# Patient Record
Sex: Male | Born: 1980
Health system: Southern US, Community
[De-identification: ages and names within clinical notes are randomized; demographics above are authoritative.]

## PROBLEM LIST (undated history)

## (undated) DIAGNOSIS — K5190 Ulcerative colitis, unspecified, without complications: Secondary | ICD-10-CM

## (undated) HISTORY — PX: WISDOM TOOTH EXTRACTION: SHX21

---

## 2008-02-26 ENCOUNTER — Emergency Department (HOSPITAL_COMMUNITY): Admission: EM | Admit: 2008-02-26 | Discharge: 2008-02-27 | Payer: Self-pay | Admitting: Emergency Medicine

## 2012-01-11 HISTORY — PX: COLONOSCOPY: SHX174

## 2015-06-16 DIAGNOSIS — K1370 Unspecified lesions of oral mucosa: Secondary | ICD-10-CM | POA: Diagnosis not present

## 2015-11-12 DIAGNOSIS — K5130 Ulcerative (chronic) rectosigmoiditis without complications: Secondary | ICD-10-CM | POA: Diagnosis not present

## 2016-01-18 DIAGNOSIS — J029 Acute pharyngitis, unspecified: Secondary | ICD-10-CM | POA: Diagnosis not present

## 2016-06-27 DIAGNOSIS — L309 Dermatitis, unspecified: Secondary | ICD-10-CM | POA: Diagnosis not present

## 2017-01-20 DIAGNOSIS — K5130 Ulcerative (chronic) rectosigmoiditis without complications: Secondary | ICD-10-CM | POA: Diagnosis not present

## 2017-02-07 DIAGNOSIS — Z3009 Encounter for other general counseling and advice on contraception: Secondary | ICD-10-CM | POA: Diagnosis not present

## 2018-01-16 DIAGNOSIS — Z3009 Encounter for other general counseling and advice on contraception: Secondary | ICD-10-CM | POA: Diagnosis not present

## 2018-02-01 DIAGNOSIS — Z302 Encounter for sterilization: Secondary | ICD-10-CM | POA: Diagnosis not present

## 2018-02-02 DIAGNOSIS — Z302 Encounter for sterilization: Secondary | ICD-10-CM | POA: Diagnosis not present

## 2018-03-25 ENCOUNTER — Emergency Department (HOSPITAL_COMMUNITY)
Admission: EM | Admit: 2018-03-25 | Discharge: 2018-03-25 | Disposition: A | Discharge status: Home / Self Care | Payer: BLUE CROSS/BLUE SHIELD | Attending: Emergency Medicine | Admitting: Emergency Medicine

## 2018-03-25 ENCOUNTER — Encounter (HOSPITAL_COMMUNITY): Payer: Self-pay

## 2018-03-25 ENCOUNTER — Emergency Department (HOSPITAL_COMMUNITY): Payer: BLUE CROSS/BLUE SHIELD

## 2018-03-25 ENCOUNTER — Other Ambulatory Visit: Payer: Self-pay

## 2018-03-25 DIAGNOSIS — M5416 Radiculopathy, lumbar region: Secondary | ICD-10-CM | POA: Diagnosis not present

## 2018-03-25 DIAGNOSIS — R52 Pain, unspecified: Secondary | ICD-10-CM | POA: Diagnosis not present

## 2018-03-25 DIAGNOSIS — Z79899 Other long term (current) drug therapy: Secondary | ICD-10-CM | POA: Insufficient documentation

## 2018-03-25 DIAGNOSIS — M5489 Other dorsalgia: Secondary | ICD-10-CM | POA: Diagnosis not present

## 2018-03-25 DIAGNOSIS — R200 Anesthesia of skin: Secondary | ICD-10-CM | POA: Diagnosis not present

## 2018-03-25 DIAGNOSIS — M545 Low back pain: Secondary | ICD-10-CM | POA: Diagnosis present

## 2018-03-25 MED ORDER — IBUPROFEN 600 MG PO TABS: 600.0000 mg | ORAL_TABLET | Freq: Four times a day (QID) | ORAL | 0 refills | Status: AC | PRN

## 2018-03-25 MED ORDER — METHOCARBAMOL 750 MG PO TABS: 750.0000 mg | ORAL_TABLET | Freq: Two times a day (BID) | ORAL | 0 refills | Status: DC

## 2018-03-25 MED ORDER — HYDROMORPHONE HCL 1 MG/ML IJ SOLN
1.0000 mg | Freq: Once | INTRAMUSCULAR | Status: AC
  Administered 2018-03-25: 1 mg via INTRAMUSCULAR
  Filled 2018-03-25: qty 1

## 2018-03-25 MED ORDER — METHOCARBAMOL 500 MG PO TABS
750.0000 mg | ORAL_TABLET | Freq: Once | ORAL | Status: AC
  Administered 2018-03-25: 750 mg via ORAL
  Filled 2018-03-25: qty 2

## 2018-03-25 MED ORDER — OXYCODONE-ACETAMINOPHEN 5-325 MG PO TABS: 1.0000 | ORAL_TABLET | Freq: Four times a day (QID) | ORAL | 0 refills | Status: AC | PRN

## 2018-03-25 NOTE — Discharge Instructions (Addendum)
Take ibuprofen every 6 hours for your pain.  Take Robaxin twice daily as needed for muscle pain or spasms.  For breakthrough pain, take 1-2 Percocet every 6 hours.  Do not drive or operate machinery while taking these medications.  Use ice and heat alternating 20 minutes on, 20 minutes off.  Attempt the exercises and stretches as tolerated daily.  Avoid positions and motions that cause you pain, but try to walk as much as possible, if this is comfortable.  Please follow-up with your doctor if your symptoms do not improving over the next 1 to 2 weeks.  Please return to the emergency department immediately if you develop any complete numbness of your groin or legs, weakness in your legs, loss of bowel or bladder control, inability to move your bowel or bladder, or any other new or concerning symptoms.  Do not drink alcohol, drive, operate machinery or participate in any other potentially dangerous activities while taking opiate pain medication as it may make you sleepy. Do not take this medication with any other sedating medications, either prescription or over-the-counter. If you were prescribed Percocet or Vicodin, do not take these with acetaminophen (Tylenol) as it is already contained within these medications and overdose of Tylenol is dangerous.   This medication is an opiate (or narcotic) pain medication and can be habit forming.  Use it as little as possible to achieve adequate pain control.  Do not use or use it with extreme caution if you have a history of opiate abuse or dependence. This medication is intended for your use only - do not give any to anyone else and keep it in a secure place where nobody else, especially children, have access to it. It will also cause or worsen constipation, so you may want to consider taking an over-the-counter stool softener while you are taking this medication.

## 2018-03-25 NOTE — ED Triage Notes (Signed)
Patient arrived via [facility]. Patient was working in backyard, was pulling on tree root, felt sharp pain in lower mid back with radiating pain that goes bilaterally down LLE and RLE. Patient is AOx4 and ambulatory at baseline.

## 2018-03-25 NOTE — ED Provider Notes (Signed)
[facility]-EMERGENCY DEPT Provider Note   CSN: [id number] Arrival date & time: 03/25/18  1628    History   Chief Complaint Chief Complaint  Patient presents with  . Back Pain    HPI Ian Benjamin is a 38 y.o. male who presents with sudden low back pain after he was pulling a root and it suddenly stopped and he kept going.  He reported severe low back pain that dropped him to the ground.  He initially had pain going down both legs, however that has resolved.  He reports some tingling in his left fifth toe and on the outside of his leg, however this is positional.  He denies any saddle anesthesia, bowel or bladder incontinence, history of cancer, IVDU, or procedure to back, fevers, urinary symptoms.  Patient took 800 mg ibuprofen prior to arrival without significant relief.     HPI  History reviewed. No pertinent past medical history.  There are no active problems to display for this patient.   History reviewed. No pertinent surgical history.      Home Medications    Prior to Admission medications   Medication Sig Start Date End Date Taking? Authorizing Provider  ibuprofen (ADVIL,MOTRIN) 200 MG tablet Take 400 mg by mouth every 6 (six) hours as needed for moderate pain.   Yes [provider]  mesalamine (LIALDA) 1.2 g EC tablet Take 1.2 g by mouth daily with breakfast.   Yes [provider]  ibuprofen (ADVIL,MOTRIN) 600 MG tablet Take 1 tablet (600 mg total) by mouth every 6 (six) hours as needed. 03/25/18   Shaquisha Wynn, Waylan Boga, PA-C  methocarbamol (ROBAXIN) 750 MG tablet Take 1 tablet (750 mg total) by mouth 2 (two) times daily. 03/25/18   Shereda Graw, Waylan Boga, PA-C  oxyCODONE-acetaminophen (PERCOCET/ROXICET) 5-325 MG tablet Take 1-2 tablets by mouth every 6 (six) hours as needed for severe pain. 03/25/18   Emi Holes, PA-C    Family History History reviewed. No pertinent family history.  Social History Social History   Tobacco Use  .  Smoking status: Never Smoker  . Smokeless tobacco: Never Used  Substance Use Topics  . Alcohol use: Yes    Comment: Occasional social drinker  . Drug use: Never     Allergies   Dye fdc red [red dye]   Review of Systems Review of Systems  Constitutional: Negative for fever.  Genitourinary: Negative for decreased urine volume, difficulty urinating and dysuria.  Musculoskeletal: Positive for back pain.  Neurological: Positive for numbness (paresthesia).     Physical Exam Updated Vital Signs BP (!) 120/94   Pulse 90   Temp 97.8 F (36.6 C) (Oral)   Resp 18   Ht 6\' 1"  (1.854 m)   Wt 83.9 kg   SpO2 100%   BMI 24.41 kg/m   Physical Exam Vitals signs and nursing note reviewed.  Constitutional:      General: He is not in acute distress.    Appearance: He is well-developed. He is not diaphoretic.  HENT:     Head: Normocephalic and atraumatic.     Mouth/Throat:     Pharynx: No oropharyngeal exudate.  Eyes:     General: No scleral icterus.       Right eye: No discharge.        Left eye: No discharge.     Conjunctiva/sclera: Conjunctivae normal.     Pupils: Pupils are equal, round, and reactive to light.  Neck:     Musculoskeletal: Normal range  of motion and neck supple.     Thyroid: No thyromegaly.  Cardiovascular:     Rate and Rhythm: Normal rate and regular rhythm.     Heart sounds: Normal heart sounds. No murmur. No friction rub. No gallop.   Pulmonary:     Effort: Pulmonary effort is normal. No respiratory distress.     Breath sounds: Normal breath sounds. No stridor. No wheezing or rales.  Abdominal:     General: Bowel sounds are normal. There is no distension.     Palpations: Abdomen is soft.     Tenderness: There is no abdominal tenderness. There is no guarding or rebound.  Musculoskeletal:       Back:  Lymphadenopathy:     Cervical: No cervical adenopathy.  Skin:    General: Skin is warm and dry.     Coloration: Skin is not pale.     Findings: No  rash.  Neurological:     Mental Status: He is alert.     Coordination: Coordination normal.     Comments: 5/5 strength to bilateral lower extremities with hip flexion/extension, plantar/dorsiflexion, sensation intact bilaterally      ED Treatments / Results  Labs (all labs ordered are listed, but only abnormal results are displayed) Labs Reviewed - No data to display  EKG None  Radiology Dg Lumbar Spine Complete  Result Date: 03/25/2018 CLINICAL DATA:  Pain with bilateral leg pain and numbness. EXAM: LUMBAR SPINE - COMPLETE 4+ VIEW COMPARISON:  None. FINDINGS: There is no evidence of lumbar spine fracture. Alignment is normal. Intervertebral disc spaces are maintained. IMPRESSION: Negative. Electronically Signed   By: Gerome Sam III M.D   On: 03/25/2018 18:16    Procedures Procedures (including critical care time)  Medications Ordered in ED Medications  HYDROmorphone (DILAUDID) injection 1 mg (1 mg Intramuscular Given 03/25/18 1729)  methocarbamol (ROBAXIN) tablet 750 mg (750 mg Oral Given 03/25/18 1728)     Initial Impression / Assessment and Plan / ED Course  I have reviewed the triage vital signs and the nursing notes.  Pertinent labs & imaging results that were available during my care of the patient were reviewed by me and considered in my medical decision making (see chart for details).        Patient presenting with acute back injury with some radicular pain after pulling a tree root.  He is neurovascularly intact.  No signs of cauda equina.  Lumbar x-ray is negative.  Patient is feeling better after Dilaudid IM and Robaxin.  Ibuprofen given prior to arrival.  Counseled on ice, heat, stretching and follow-up to PCP if symptoms are not improving.  Will discharge home with short course of Percocet, ibuprofen, Robaxin.  Patient understands and agrees with plan.  Patient vitals stable throughout ED course and discharged in satisfactory condition.  Final Clinical  Impressions(s) / ED Diagnoses   Final diagnoses:  Acute lumbar radiculopathy    ED Discharge Orders         Ordered    oxyCODONE-acetaminophen (PERCOCET/ROXICET) 5-325 MG tablet  Every 6 hours PRN     03/25/18 1834    ibuprofen (ADVIL,MOTRIN) 600 MG tablet  Every 6 hours PRN     03/25/18 1834    methocarbamol (ROBAXIN) 750 MG tablet  2 times daily     03/25/18 1834           Emi Holes, PA-C 03/25/18 1900    Melene Plan, DO 03/25/18 2138

## 2018-03-27 DIAGNOSIS — F4323 Adjustment disorder with mixed anxiety and depressed mood: Secondary | ICD-10-CM | POA: Diagnosis not present

## 2018-04-06 DIAGNOSIS — L308 Other specified dermatitis: Secondary | ICD-10-CM | POA: Diagnosis not present

## 2018-04-06 DIAGNOSIS — J302 Other seasonal allergic rhinitis: Secondary | ICD-10-CM | POA: Diagnosis not present

## 2018-04-06 DIAGNOSIS — L239 Allergic contact dermatitis, unspecified cause: Secondary | ICD-10-CM | POA: Diagnosis not present

## 2018-04-20 DIAGNOSIS — K4090 Unilateral inguinal hernia, without obstruction or gangrene, not specified as recurrent: Secondary | ICD-10-CM | POA: Diagnosis not present

## 2018-04-20 DIAGNOSIS — N509 Disorder of male genital organs, unspecified: Secondary | ICD-10-CM | POA: Diagnosis not present

## 2018-04-24 ENCOUNTER — Other Ambulatory Visit: Payer: Self-pay | Admitting: Family Medicine

## 2018-04-24 ENCOUNTER — Other Ambulatory Visit: Payer: BLUE CROSS/BLUE SHIELD

## 2018-04-24 DIAGNOSIS — N5089 Other specified disorders of the male genital organs: Secondary | ICD-10-CM

## 2018-04-26 ENCOUNTER — Ambulatory Visit
Admission: RE | Admit: 2018-04-26 | Discharge: 2018-04-26 | Disposition: A | Discharge status: Home / Self Care | Payer: BLUE CROSS/BLUE SHIELD | Source: Ambulatory Visit | Attending: Family Medicine | Admitting: Family Medicine

## 2018-04-26 ENCOUNTER — Other Ambulatory Visit: Payer: Self-pay

## 2018-04-26 DIAGNOSIS — N5089 Other specified disorders of the male genital organs: Secondary | ICD-10-CM

## 2018-05-19 ENCOUNTER — Emergency Department (HOSPITAL_COMMUNITY)
Admission: EM | Admit: 2018-05-19 | Discharge: 2018-05-19 | Disposition: A | Discharge status: Home / Self Care | Payer: BLUE CROSS/BLUE SHIELD | Attending: Emergency Medicine | Admitting: Emergency Medicine

## 2018-05-19 ENCOUNTER — Encounter (HOSPITAL_COMMUNITY): Payer: Self-pay | Admitting: Student

## 2018-05-19 ENCOUNTER — Emergency Department (HOSPITAL_COMMUNITY): Payer: BLUE CROSS/BLUE SHIELD

## 2018-05-19 ENCOUNTER — Ambulatory Visit (HOSPITAL_COMMUNITY)
Admission: EM | Admit: 2018-05-19 | Discharge: 2018-05-19 | Disposition: A | Discharge status: Home / Self Care | Payer: BLUE CROSS/BLUE SHIELD | Source: Home / Self Care | Attending: Family Medicine | Admitting: Family Medicine

## 2018-05-19 ENCOUNTER — Other Ambulatory Visit: Payer: Self-pay

## 2018-05-19 ENCOUNTER — Encounter (HOSPITAL_COMMUNITY): Payer: Self-pay | Admitting: Emergency Medicine

## 2018-05-19 DIAGNOSIS — Z79899 Other long term (current) drug therapy: Secondary | ICD-10-CM | POA: Diagnosis not present

## 2018-05-19 DIAGNOSIS — R42 Dizziness and giddiness: Secondary | ICD-10-CM | POA: Diagnosis not present

## 2018-05-19 DIAGNOSIS — R519 Headache, unspecified: Secondary | ICD-10-CM

## 2018-05-19 DIAGNOSIS — R51 Headache: Secondary | ICD-10-CM | POA: Insufficient documentation

## 2018-05-19 DIAGNOSIS — M542 Cervicalgia: Secondary | ICD-10-CM | POA: Insufficient documentation

## 2018-05-19 HISTORY — DX: Ulcerative colitis, unspecified, without complications: K51.90

## 2018-05-19 LAB — BASIC METABOLIC PANEL
Anion gap: 10 (ref 5–15)
BUN: 8 mg/dL (ref 6–20)
CO2: 23 mmol/L (ref 22–32)
Calcium: 9.4 mg/dL (ref 8.9–10.3)
Chloride: 103 mmol/L (ref 98–111)
Creatinine, Ser: 0.97 mg/dL (ref 0.61–1.24)
GFR calc Af Amer: >60 mL/min (ref >60)
GFR calc non Af Amer: >60 mL/min (ref >60)
Glucose, Bld: 129 mg/dL — ABNORMAL HIGH (ref 70–99)
Potassium: 3.5 mmol/L (ref 3.5–5.1)
Sodium: 136 mmol/L (ref 135–145)

## 2018-05-19 LAB — CBC
HCT: 41.4 % (ref 39.0–52.0)
Hemoglobin: 13.9 g/dL (ref 13.0–17.0)
MCH: 28.4 pg (ref 26.0–34.0)
MCHC: 33.6 g/dL (ref 30.0–36.0)
MCV: 84.7 fL (ref 80.0–100.0)
Platelets: 241 10*3/uL (ref 150–400)
RBC: 4.89 MIL/uL (ref 4.22–5.81)
RDW: 13.5 % (ref 11.5–15.5)
WBC: 8.7 10*3/uL (ref 4.0–10.5)
nRBC: 0 % (ref 0.0–0.2)

## 2018-05-19 MED ORDER — BUTALBITAL-APAP-CAFFEINE 50-325-40 MG PO TABS: 1.0000 | ORAL_TABLET | Freq: Four times a day (QID) | ORAL | 0 refills | Status: AC | PRN

## 2018-05-19 MED ORDER — PROCHLORPERAZINE EDISYLATE 10 MG/2ML IJ SOLN
10.0000 mg | Freq: Once | INTRAMUSCULAR | Status: AC
  Administered 2018-05-19: 10 mg via INTRAVENOUS
  Filled 2018-05-19: qty 2

## 2018-05-19 MED ORDER — SODIUM CHLORIDE 0.9 % IV SOLN: INTRAVENOUS | Status: DC

## 2018-05-19 MED ORDER — LIDOCAINE 5 % EX PTCH: 1.0000 | MEDICATED_PATCH | CUTANEOUS | 0 refills | Status: AC

## 2018-05-19 MED ORDER — SODIUM CHLORIDE 0.9 % IV BOLUS
1000.0000 mL | Freq: Once | INTRAVENOUS | Status: AC
  Administered 2018-05-19: 1000 mL via INTRAVENOUS

## 2018-05-19 MED ORDER — LIDOCAINE 5 % EX PTCH
1.0000 | MEDICATED_PATCH | CUTANEOUS | Status: DC
  Administered 2018-05-19: 1 via TRANSDERMAL
  Filled 2018-05-19: qty 1

## 2018-05-19 MED ORDER — DIPHENHYDRAMINE HCL 50 MG/ML IJ SOLN
12.5000 mg | Freq: Once | INTRAMUSCULAR | Status: AC
  Administered 2018-05-19: 12.5 mg via INTRAVENOUS
  Filled 2018-05-19: qty 1

## 2018-05-19 NOTE — ED Notes (Signed)
Bed: UC09 Expected date:  Expected time:  Means of arrival:  Comments: 400pm

## 2018-05-19 NOTE — ED Provider Notes (Signed)
[facility] Provider Note   CSN: [id number] Arrival date & time: 05/19/18  1651    History   Chief Complaint Chief Complaint  Patient presents with   Headache    HPI Ian Benjamin is a 38 y.o. male with a hx of ulcerative colitis who presents to the ED with complaints of headache which began approximately 6 days ago. Patient states pain began gradually & has been progressively worsening- no sudden onset or thunderclap sensation. He states pain initially was to the L side of his neck radiating to the back of the head then began generalized. Pain has been constant. It is currently a 6/10 in severity. He has had associated nausea, lightheadedness, and occasional blurry vision when pain gets really bad. He states sometimes when he transitions from sitting to standing the lightheadedness makes him feel though he might pass out. He has been taking ibuprofen- most recent dose this afternoon, w/ some improvement of his sxs, however he does not want to continue taking this due to his hx of UC. He states he has had somewhat similar quality of headaches that start in the L neck, but they have not gotten this bad since he was a child w/ associated sxs and radiating to the entire head. Denies URI sxs, fever, chills, emesis, chest pain, dyspnea, syncope, dizziness like the room spinning, numbness, or weakness. Denies traumatic injury to the neck, but does lift his 491 and 10475 year old children frequently at home. Sent from urgent care for further assessment/management.     HPI  History reviewed. No pertinent past medical history.  There are no active problems to display for this patient.   History reviewed. No pertinent surgical history.      Home Medications    Prior to Admission medications   Medication Sig Start Date End Date Taking? Authorizing Provider  ibuprofen (ADVIL,MOTRIN) 200 MG tablet Take 400 mg by mouth every 6 (six) hours as needed for moderate  pain.    [provider]  ibuprofen (ADVIL,MOTRIN) 600 MG tablet Take 1 tablet (600 mg total) by mouth every 6 (six) hours as needed. 03/25/18   Law, Waylan BogaAlexandra M, PA-C  mesalamine (LIALDA) 1.2 g EC tablet Take 1.2 g by mouth daily with breakfast.    [provider]  methocarbamol (ROBAXIN) 750 MG tablet Take 1 tablet (750 mg total) by mouth 2 (two) times daily. 03/25/18   Law, Waylan BogaAlexandra M, PA-C  oxyCODONE-acetaminophen (PERCOCET/ROXICET) 5-325 MG tablet Take 1-2 tablets by mouth every 6 (six) hours as needed for severe pain. 03/25/18   Emi HolesLaw, Alexandra M, PA-C    Family History History reviewed. No pertinent family history.  Social History Social History   Tobacco Use   Smoking status: Never Smoker   Smokeless tobacco: Never Used  Substance Use Topics   Alcohol use: Yes    Comment: Occasional social drinker   Drug use: Never     Allergies   Dye fdc red [red dye]   Review of Systems Review of Systems  Constitutional: Negative for chills and fever.  HENT: Negative for congestion and ear pain.   Eyes: Positive for visual disturbance.  Respiratory: Negative for shortness of breath.   Cardiovascular: Negative for chest pain, palpitations and leg swelling.  Gastrointestinal: Positive for nausea. Negative for abdominal pain, blood in stool, constipation, diarrhea and vomiting.  Genitourinary: Negative for dysuria.  Musculoskeletal: Positive for neck pain.  Skin: Negative for wound.  Neurological: Positive for light-headedness and  headaches. Negative for dizziness, syncope, speech difficulty, weakness and numbness.  All other systems reviewed and are negative.    Physical Exam Updated Vital Signs BP 127/80    Pulse 94    Temp 99 F (37.2 C) (Oral)    Resp 18    SpO2 100%   Physical Exam Vitals signs and nursing note reviewed.  Constitutional:      General: He is not in acute distress.    Appearance: He is well-developed. He is not toxic-appearing.  HENT:       Head: Normocephalic and atraumatic.     Right Ear: Tympanic membrane normal.     Left Ear: Tympanic membrane normal.     Nose: Nose normal.     Mouth/Throat:     Pharynx: Oropharynx is clear. Uvula midline.  Eyes:     General:        Right eye: No discharge.        Left eye: No discharge.     Extraocular Movements: Extraocular movements intact.     Conjunctiva/sclera: Conjunctivae normal.     Pupils: Pupils are equal, round, and reactive to light.     Comments: No proptosis.   Neck:     Musculoskeletal: Neck supple. Muscular tenderness (L parapsinal, specifically trapezius) present. No edema, neck rigidity, crepitus or spinous process tenderness.  Cardiovascular:     Rate and Rhythm: Normal rate and regular rhythm.  Pulmonary:     Effort: Pulmonary effort is normal. No respiratory distress.     Breath sounds: Normal breath sounds. No wheezing, rhonchi or rales.  Abdominal:     General: There is no distension.     Palpations: Abdomen is soft.     Tenderness: There is no abdominal tenderness.  Lymphadenopathy:     Cervical: No cervical adenopathy.  Skin:    General: Skin is warm and dry.     Findings: No rash.  Neurological:     Mental Status: He is alert.     Comments: Alert. Clear speech. No facial droop. CNIII-XII grossly intact. Bilateral upper and lower extremities' sensation grossly intact. 5/5 symmetric strength with grip strength and with plantar and dorsi flexion bilaterally. Normal finger to nose bilaterally. Negative pronator drift. Negative Romberg sign. Gait is steady and intact.   Psychiatric:        Behavior: Behavior normal.    ED Treatments / Results  Labs (all labs ordered are listed, but only abnormal results are displayed) Labs Reviewed  BASIC METABOLIC PANEL - Abnormal; Notable for the following components:      Result Value   Glucose, Bld 129 (*)    All other components within normal limits  CBC    EKG EKG  Interpretation  Date/Time:  Saturday May 19 2018 17:26:43 EDT Ventricular Rate:  88 PR Interval:    QRS Duration: 82 QT Interval:  327 QTC Calculation: 396 R Axis:   61 Text Interpretation:  Normal sinus rhythm Normal ECG No old tracing to compare Confirmed by Margarita Grizzle [phone]) on 05/19/2018 6:43:16 PM   Radiology Ct Head Wo Contrast  Result Date: 05/19/2018 CLINICAL DATA:  38 year old male with acute headache, dizziness, blurred vision and nausea for 2 days. EXAM: CT HEAD WITHOUT CONTRAST TECHNIQUE: Contiguous axial images were obtained from the base of the skull through the vertex without intravenous contrast. COMPARISON:  None. FINDINGS: Brain: No evidence of acute infarction, hemorrhage, hydrocephalus, extra-axial collection or mass lesion/mass effect. Vascular: No hyperdense vessel or unexpected calcification. Skull: Normal.  Negative for fracture or focal lesion. Sinuses/Orbits: No acute finding. Other: None. IMPRESSION: Unremarkable noncontrast head CT. Electronically Signed   By: Harmon Pier M.D.   On: 05/19/2018 17:49    Procedures Procedures (including critical care time)  Medications Ordered in ED Medications  sodium chloride 0.9 % bolus 1,000 mL (0 mLs Intravenous Stopped 05/19/18 1803)    And  0.9 %  sodium chloride infusion ( Intravenous Canceled Entry 05/19/18 1820)  lidocaine (LIDODERM) 5 % 1 patch (1 patch Transdermal Patch Applied 05/19/18 1801)  prochlorperazine (COMPAZINE) injection 10 mg (10 mg Intravenous Given 05/19/18 1725)  diphenhydrAMINE (BENADRYL) injection 12.5 mg (12.5 mg Intravenous Given 05/19/18 1725)    Initial Impression / Assessment and Plan / ED Course  I have reviewed the triage vital signs and the nursing notes.  Pertinent labs & imaging results that were available during my care of the patient were reviewed by me and considered in my medical decision making (see chart for details).    Patient presents with complaint of headache. Sent for UC for  further evalu/management. Patient is nontoxic appearing, vitals without significant abnormality. Patient does state that this is the worst headache he has had in terms of severity, but he has had somewhat similar headaches in terms of quality - CT head unremarkable- no bleed or mass. Given some lightheadedness, no syncope, basic labs & EKG obtained- labs unremarkable- no anemia or electrolyte derangement, EKG w/ NSR- no concerning arrhythmia. Headache had gradual onset with steady progression in severity- No neuro deficits, no fever or nuchal rigidity, no proptosis- overall presentation does not seem consistent w/ SAH, ICH, ischemic CVA, dural venous sinus thrombosis, acute glaucoma, giant cell arteritis, mass, or meningitis.  Suspect migraine vs. Tension headache related to muscle spasm w/ TTP of L trapezius. Patient treated for headache with migraine cocktail with improvement, feels ready to go home. Will provide lidoderm patches for muscle tightness in L trap as well as fioricet. Neurology follow up. I discussed treatment plan, need for follow-up, and return precautions with the patient. Provided opportunity for questions, patient confirmed understanding and is in agreement with plan.   Findings and plan of care discussed with supervising physician Dr. Rosalia Hammers who is in agreement.   Final Clinical Impressions(s) / ED Diagnoses   Final diagnoses:  Acute nonintractable headache, unspecified headache type    ED Discharge Orders         Ordered    lidocaine (LIDODERM) 5 %  Every 24 hours     05/19/18 1851    butalbital-acetaminophen-caffeine (FIORICET) 50-325-40 MG tablet  Every 6 hours PRN     05/19/18 1851           Katrinna Travieso, Pleas Koch, PA-C 05/19/18 1918    Margarita Grizzle, MD 05/20/18 1659

## 2018-05-19 NOTE — Discharge Instructions (Addendum)
You are seen in the emergency department today for a headache.  The CT scan of your head was normal.  Your labs were reassuring.  Your EKG was normal.  We suspect that you have a degree of a migraine as well as tension headache.  You are sending you home with Lidoderm patches, please apply 1 patch to the left neck/shoulder area where you are having pain/tightness once per day. We are also sending you home with fioricet to take every 6 hours as needed for pain which can help with migraines.   We have prescribed you new medication(s) today. Discuss the medications prescribed today with your pharmacist as they can have adverse effects and interactions with your other medicines including over the counter and prescribed medications. Seek medical evaluation if you start to experience new or abnormal symptoms after taking one of these medicines, seek care immediately if you start to experience difficulty breathing, feeling of your throat closing, facial swelling, or rash as these could be indications of a more serious allergic reaction   Follow up with neurology within 3 days.  Return to the ER for new or worsening symptoms including but not limited to increased pain, change in quality of headache, numbness, weakness, passing out, fever, or any other concerns.

## 2018-05-19 NOTE — ED Notes (Signed)
ED Provider at bedside. 

## 2018-05-19 NOTE — ED Triage Notes (Signed)
Pt reports migrane headache that started 6 days ago along with dizziness/feeling lightheaded for the past 2 days, blurred vision and nausea. Pt a.o, nad noted

## 2018-05-19 NOTE — ED Triage Notes (Signed)
Pt c/o headache poor appetite with body aches. For the last few days.

## 2018-05-19 NOTE — ED Provider Notes (Addendum)
[facility] CARE CENTER   [id number] 05/19/18 Arrival Time: 1547  ASSESSMENT & PLAN:  1. Acute intractable headache, unspecified headache type    Given self-described worst headache/pain of his life, we have agreed that ED evaluation is the best option at this time. No focal neurologic abnormalities which is reassuring. Questions answered. Discharged to ED; stable.  Follow-up Information    Go to  [facility] EMERGENCY DEPARTMENT.   Specialty:  Emergency Medicine Contact information: [address] [id number] [place] [address] [phone]         Reviewed expectations re: course of current medical issues. Questions answered. Outlined signs and symptoms indicating need for more acute intervention. Patient verbalized understanding. After Visit Summary given.   SUBJECTIVE:  Ian Benjamin is a 38 y.o. male who presents with self-described worst headache/pain of his life. Gradual onset 5-6 days ago. First noticed neck pain. Now reports occipital and frontal headache that "seems to move around." History of intermittent "cluster" headaches that begin in his neck; last one approx two years ago. No formal diagnosis of any specific type of headache. No preceding aura. Mild nausea without emesis since onset. Decreased appetite. Reports transient episodes of "blurry vision"; none currently. No hearing changes. Mild photophobia. Afebrile. No recent illnesses. No sinus pressure/congestion/seasonal allergies reported. No extremity sensation changes or weakness. Headache does wake him from sleep at times. Has been taking 600mg  ibuprofen TID; this makes headache bearable. Is ambulatory without dysequilibrium. Over the past few days reports "a dizzy feeling" described as lightheadedness. No vertigo. Current headache has limited normal daily activities. No aphasia. No seizure activity. Normal bowel/bladder habits. No head injury reported.  Reports no  frequent/heavy alcohol use. No illicit drug use.  ROS: As per HPI. All other systems negative.    OBJECTIVE:  Vitals:   05/19/18 1607 05/19/18 1608  BP: 125/80   Pulse: 100   Resp: 18   Temp:  99 F (37.2 C)  SpO2: 100%     General appearance: alert; no apparent distress Eyes: PERRLA; EOMI; conjunctiva normal HENT: normocephalic; atraumatic; TMs appear normal Neck: supple with FROM; "but feels very stiff" when extending neck Lungs: clear to auscultation bilaterally; unlabored Heart: regular rate and rhythm Extremities: no edema; symmetrical with no gross deformities Skin: warm and dry Neurologic: CN 2-12 grossly intact; normal gait; normal symmetric reflexes; normal extremity strength and sensation throughout Psychological: alert and cooperative; normal mood and affect   Allergies  Allergen Reactions  . Dye Fdc Red [Red Dye] Other (See Comments)    Purple Dye caused seizures as a child   PMH: As in HPI.  Social History   Socioeconomic History  . Marital status: Married    Spouse name: Not on file  . Number of children: Not on file  . Years of education: Not on file  . Highest education level: Not on file  Occupational History  . Not on file  Social Needs  . Financial resource strain: Not on file  . Food insecurity:    Worry: Not on file    Inability: Not on file  . Transportation needs:    Medical: Not on file    Non-medical: Not on file  Tobacco Use  . Smoking status: Never Smoker  . Smokeless tobacco: Never Used  Substance and Sexual Activity  . Alcohol use: Yes    Comment: Occasional social drinker  . Drug use: Never  . Sexual activity: Yes    Birth control/protection: Surgical  Comment: Vasectomy  Lifestyle  . Physical activity:    Days per week: Not on file    Minutes per session: Not on file  . Stress: Not on file  Relationships  . Social connections:    Talks on phone: Not on file    Gets together: Not on file    Attends religious  service: Not on file    Active member of club or organization: Not on file    Attends meetings of clubs or organizations: Not on file    Relationship status: Not on file  . Intimate partner violence:    Fear of current or ex partner: Not on file    Emotionally abused: Not on file    Physically abused: Not on file    Forced sexual activity: Not on file  Other Topics Concern  . Not on file  Social History Narrative  . Not on file   FH: No h/o headaches known.  History reviewed. No pertinent surgical history.   Mardella LaymanHagler, Zen Felling, MD 05/19/18 1654    Mardella LaymanHagler, Murry Khiev, MD 05/19/18 [id number]    Mardella LaymanHagler, Asim Gersten, MD 05/19/18 [phone]

## 2018-05-19 NOTE — ED Notes (Signed)
Patient transported to CT 

## 2018-05-23 DIAGNOSIS — Z20828 Contact with and (suspected) exposure to other viral communicable diseases: Secondary | ICD-10-CM | POA: Diagnosis not present

## 2018-05-25 DIAGNOSIS — G4489 Other headache syndrome: Secondary | ICD-10-CM | POA: Diagnosis not present

## 2018-05-29 ENCOUNTER — Emergency Department (HOSPITAL_COMMUNITY): Payer: BLUE CROSS/BLUE SHIELD

## 2018-05-29 ENCOUNTER — Emergency Department (HOSPITAL_COMMUNITY)
Admission: EM | Admit: 2018-05-29 | Discharge: 2018-05-30 | Disposition: A | Discharge status: Home / Self Care | Payer: BLUE CROSS/BLUE SHIELD | Attending: Emergency Medicine | Admitting: Emergency Medicine

## 2018-05-29 ENCOUNTER — Encounter (HOSPITAL_COMMUNITY): Payer: Self-pay | Admitting: Emergency Medicine

## 2018-05-29 ENCOUNTER — Other Ambulatory Visit: Payer: Self-pay

## 2018-05-29 DIAGNOSIS — Z79899 Other long term (current) drug therapy: Secondary | ICD-10-CM | POA: Insufficient documentation

## 2018-05-29 DIAGNOSIS — R197 Diarrhea, unspecified: Secondary | ICD-10-CM | POA: Diagnosis not present

## 2018-05-29 DIAGNOSIS — M542 Cervicalgia: Secondary | ICD-10-CM | POA: Insufficient documentation

## 2018-05-29 DIAGNOSIS — R10814 Left lower quadrant abdominal tenderness: Secondary | ICD-10-CM | POA: Insufficient documentation

## 2018-05-29 DIAGNOSIS — R51 Headache: Secondary | ICD-10-CM | POA: Insufficient documentation

## 2018-05-29 DIAGNOSIS — N200 Calculus of kidney: Secondary | ICD-10-CM | POA: Diagnosis not present

## 2018-05-29 DIAGNOSIS — R509 Fever, unspecified: Secondary | ICD-10-CM | POA: Insufficient documentation

## 2018-05-29 DIAGNOSIS — K529 Noninfective gastroenteritis and colitis, unspecified: Secondary | ICD-10-CM | POA: Diagnosis not present

## 2018-05-29 DIAGNOSIS — Z20828 Contact with and (suspected) exposure to other viral communicable diseases: Secondary | ICD-10-CM | POA: Insufficient documentation

## 2018-05-29 DIAGNOSIS — R0602 Shortness of breath: Secondary | ICD-10-CM | POA: Insufficient documentation

## 2018-05-29 DIAGNOSIS — R519 Headache, unspecified: Secondary | ICD-10-CM

## 2018-05-29 DIAGNOSIS — R1084 Generalized abdominal pain: Secondary | ICD-10-CM | POA: Diagnosis not present

## 2018-05-29 LAB — CBC WITH DIFFERENTIAL/PLATELET
Abs Immature Granulocytes: 0 10*3/uL (ref 0.00–0.07)
Basophils Absolute: 0.1 10*3/uL (ref 0.0–0.1)
Basophils Relative: 1 %
Eosinophils Absolute: 0 10*3/uL (ref 0.0–0.5)
Eosinophils Relative: 0 %
HCT: 38.1 % — ABNORMAL LOW (ref 39.0–52.0)
Hemoglobin: 12.7 g/dL — ABNORMAL LOW (ref 13.0–17.0)
Lymphocytes Relative: 28 %
Lymphs Abs: 3.8 10*3/uL (ref 0.7–4.0)
MCH: 27.4 pg (ref 26.0–34.0)
MCHC: 33.3 g/dL (ref 30.0–36.0)
MCV: 82.3 fL (ref 80.0–100.0)
Monocytes Absolute: 0.8 10*3/uL (ref 0.1–1.0)
Monocytes Relative: 6 %
Neutro Abs: 8.7 10*3/uL — ABNORMAL HIGH (ref 1.7–7.7)
Neutrophils Relative %: 65 %
Platelets: 335 10*3/uL (ref 150–400)
RBC: 4.63 MIL/uL (ref 4.22–5.81)
RDW: 13.7 % (ref 11.5–15.5)
WBC: 13.4 10*3/uL — ABNORMAL HIGH (ref 4.0–10.5)
nRBC: 0 % (ref 0.0–0.2)
nRBC: 0 /100 WBC

## 2018-05-29 LAB — COMPREHENSIVE METABOLIC PANEL
ALT: 305 U/L — ABNORMAL HIGH (ref 0–44)
AST: 127 U/L — ABNORMAL HIGH (ref 15–41)
Albumin: 3.1 g/dL — ABNORMAL LOW (ref 3.5–5.0)
Alkaline Phosphatase: 191 U/L — ABNORMAL HIGH (ref 38–126)
Anion gap: 11 (ref 5–15)
BUN: 9 mg/dL (ref 6–20)
CO2: 21 mmol/L — ABNORMAL LOW (ref 22–32)
Calcium: 8.6 mg/dL — ABNORMAL LOW (ref 8.9–10.3)
Chloride: 99 mmol/L (ref 98–111)
Creatinine, Ser: 1.06 mg/dL (ref 0.61–1.24)
GFR calc Af Amer: >60 mL/min (ref >60)
GFR calc non Af Amer: >60 mL/min (ref >60)
Glucose, Bld: 103 mg/dL — ABNORMAL HIGH (ref 70–99)
Potassium: 4.1 mmol/L (ref 3.5–5.1)
Sodium: 131 mmol/L — ABNORMAL LOW (ref 135–145)
Total Bilirubin: 0.7 mg/dL (ref 0.3–1.2)
Total Protein: 6.6 g/dL (ref 6.5–8.1)

## 2018-05-29 LAB — CBC
HCT: 38.8 % — ABNORMAL LOW (ref 39.0–52.0)
Hemoglobin: 13.1 g/dL (ref 13.0–17.0)
MCH: 28.1 pg (ref 26.0–34.0)
MCHC: 33.8 g/dL (ref 30.0–36.0)
MCV: 83.1 fL (ref 80.0–100.0)
Platelets: 327 10*3/uL (ref 150–400)
RBC: 4.67 MIL/uL (ref 4.22–5.81)
RDW: 14 % (ref 11.5–15.5)
WBC: 14.8 10*3/uL — ABNORMAL HIGH (ref 4.0–10.5)
nRBC: 0 % (ref 0.0–0.2)

## 2018-05-29 LAB — LIPASE, BLOOD: Lipase: 37 U/L (ref 11–51)

## 2018-05-29 LAB — LACTIC ACID, PLASMA: Lactic Acid, Venous: 1.4 mmol/L (ref 0.5–1.9)

## 2018-05-29 LAB — URINALYSIS, ROUTINE W REFLEX MICROSCOPIC
Bacteria, UA: NONE SEEN
Bilirubin Urine: NEGATIVE
Glucose, UA: NEGATIVE mg/dL
Ketones, ur: NEGATIVE mg/dL
Leukocytes,Ua: NEGATIVE
Nitrite: NEGATIVE
Protein, ur: NEGATIVE mg/dL
Specific Gravity, Urine: 1.005 (ref 1.005–1.030)
pH: 8 (ref 5.0–8.0)

## 2018-05-29 LAB — C DIFFICILE QUICK SCREEN W PCR REFLEX
C Diff antigen: NEGATIVE
C Diff interpretation: NOT DETECTED
C Diff toxin: NEGATIVE

## 2018-05-29 LAB — SEDIMENTATION RATE: Sed Rate: 15 mm/hr (ref 0–16)

## 2018-05-29 LAB — C-REACTIVE PROTEIN: CRP: 4.4 mg/dL — ABNORMAL HIGH (ref <1.0)

## 2018-05-29 LAB — PROTIME-INR
INR: 1.2 (ref 0.8–1.2)
Prothrombin Time: 14.7 seconds (ref 11.4–15.2)

## 2018-05-29 LAB — SARS CORONAVIRUS 2 BY RT PCR (HOSPITAL ORDER, PERFORMED IN ~~LOC~~ HOSPITAL LAB): SARS Coronavirus 2: NEGATIVE

## 2018-05-29 MED ORDER — IOHEXOL 300 MG/ML  SOLN
100.0000 mL | Freq: Once | INTRAMUSCULAR | Status: AC | PRN
  Administered 2018-05-29: 100 mL via INTRAVENOUS

## 2018-05-29 MED ORDER — SODIUM CHLORIDE 0.9% FLUSH
3.0000 mL | Freq: Once | INTRAVENOUS | Status: AC
  Administered 2018-05-29: 3 mL via INTRAVENOUS

## 2018-05-29 MED ORDER — LIDOCAINE 5 % EX PTCH
1.0000 | MEDICATED_PATCH | CUTANEOUS | Status: DC
  Administered 2018-05-29: 1 via TRANSDERMAL
  Filled 2018-05-29: qty 1

## 2018-05-29 MED ORDER — ACETAMINOPHEN 325 MG PO TABS
650.0000 mg | ORAL_TABLET | Freq: Once | ORAL | Status: AC
  Administered 2018-05-29: 650 mg via ORAL
  Filled 2018-05-29: qty 2

## 2018-05-29 MED ORDER — METHOCARBAMOL 500 MG PO TABS
750.0000 mg | ORAL_TABLET | Freq: Once | ORAL | Status: AC
  Administered 2018-05-29: 750 mg via ORAL
  Filled 2018-05-29: qty 2

## 2018-05-29 MED ORDER — SODIUM CHLORIDE 0.9 % IV BOLUS
1000.0000 mL | Freq: Once | INTRAVENOUS | Status: AC
  Administered 2018-05-29: 1000 mL via INTRAVENOUS

## 2018-05-29 NOTE — ED Notes (Signed)
Got patient on the monitor did vitals patient is resting with call bell in reach 

## 2018-05-29 NOTE — ED Provider Notes (Signed)
[facility] EMERGENCY DEPARTMENT Provider Note   CSN: [id number] Arrival date & time: 05/29/18  1146    History   Chief Complaint Chief Complaint  Patient presents with  . Diarrhea    HPI Ian Benjamin is a 38 y.o. male.     HPI   Ian Benjamin is a 38 y.o. male, with a history of ulcerative colitis, presenting to the ED with fever and abdominal discomfort.  Patient also complains of a headache.  Patient began having a headache recurrent since around May 3.  States he has had similar headaches in the past and has associated them with tension headaches.  Headache is throbbing, left-sided, extends down the back of the head into the left trapezius. Since headache began, he has been taking 1400 to 1800 mg of ibuprofen daily.  Last ibuprofen was last night around 8 PM.  He was seen in the ED May 9.  At that time, he was only complaining of headache.    Around May 11 he began to have fever with T-max 102 F. About 4 days ago, he began to have abdominal discomfort that was overall generalized, but more intense in the left lower quadrant, rates it 4/10, nonradiating.  Along with the abdominal discomfort, he began to have bloody diarrhea, 4-11 episodes daily.  Mixture of dark stools and bright red blood. He states bright red bleeding episodes would not be unusual if it were a flare of his ulcerative colitis, however, he does not typically have fever with this.  In addition to the above symptoms, he also notes occasional shortness of breath and generalized fatigue.  He has only taken Tylenol one time since these symptoms began. He notes in the previous couple months he has been drinking about 2 beers daily.  This alcohol consumption stopped around the end of April.  He states his general practitioner tested him for COVID-19 coronavirus within the last week and he received word today that his result was negative.  Denies nausea, vomiting, cough, chest pain, syncope,  neurologic deficits, vision abnormalities, falls/trauma, urinary symptoms, testicular pain, scrotal swelling, or any other complaints.  Sees Dr. Michail Sermon with [facility].    Past Medical History:  Diagnosis Date  . Ulcerative colitis ([place])     There are no active problems to display for this patient.   Past Surgical History:  Procedure Laterality Date  . COLONOSCOPY  2014        Home Medications    Prior to Admission medications   Medication Sig Start Date End Date Taking? Authorizing Provider  butalbital-acetaminophen-caffeine (FIORICET) 50-325-40 MG tablet Take 1 tablet by mouth every 6 (six) hours as needed for headache. 05/19/18   Petrucelli, Aldona Bar R, PA-C  ibuprofen (ADVIL,MOTRIN) 200 MG tablet Take 400 mg by mouth every 6 (six) hours as needed for moderate pain.    [provider]  ibuprofen (ADVIL,MOTRIN) 600 MG tablet Take 1 tablet (600 mg total) by mouth every 6 (six) hours as needed. 03/25/18   Law, Bea Graff, PA-C  lidocaine (LIDODERM) 5 % Place 1 patch onto the skin daily. Place to L side of neck where your pain is daily as needed for discomfort. Remove & Discard patch within 12 hours or as directed by MD 05/19/18   Petrucelli, Glynda Jaeger, PA-C  mesalamine (LIALDA) 1.2 g EC tablet Take 1.2 g by mouth daily with breakfast.    [provider]  methocarbamol (ROBAXIN) 750 MG tablet Take 1 tablet (750 mg  total) by mouth 2 (two) times daily. 03/25/18   Law, Bea Graff, PA-C  oxyCODONE-acetaminophen (PERCOCET/ROXICET) 5-325 MG tablet Take 1-2 tablets by mouth every 6 (six) hours as needed for severe pain. 03/25/18   Frederica Kuster, PA-C    Family History No family history on file.  Social History Social History   Tobacco Use  . Smoking status: Never Smoker  . Smokeless tobacco: Never Used  Substance Use Topics  . Alcohol use: Yes    Comment: Occasional social drinker  . Drug use: Never     Allergies   Dye fdc red [red dye]   Review of  Systems Review of Systems  Constitutional: Positive for activity change and fever. Negative for diaphoresis.  HENT: Negative for congestion, sore throat and trouble swallowing.   Respiratory: Positive for shortness of breath. Negative for cough.   Cardiovascular: Negative for chest pain and leg swelling.  Gastrointestinal: Positive for abdominal pain, blood in stool and diarrhea. Negative for nausea and vomiting.  Genitourinary: Negative for dysuria, flank pain, hematuria, scrotal swelling and testicular pain.  Musculoskeletal: Positive for neck pain. Negative for back pain and neck stiffness.  Skin: Negative for rash.  Neurological: Positive for headaches. Negative for dizziness, seizures, syncope, weakness, light-headedness and numbness.  All other systems reviewed and are negative.    Physical Exam Updated Vital Signs BP 105/69   Pulse (!) 108   Temp 98.7 F (37.1 C) (Oral)   Resp 18   SpO2 100%   Physical Exam Vitals signs and nursing note reviewed.  Constitutional:      General: He is not in acute distress.    Appearance: He is well-developed. He is not diaphoretic.  HENT:     Head: Normocephalic and atraumatic.     Mouth/Throat:     Mouth: Mucous membranes are moist.     Pharynx: Oropharynx is clear.  Eyes:     Extraocular Movements: Extraocular movements intact.     Conjunctiva/sclera: Conjunctivae normal.     Pupils: Pupils are equal, round, and reactive to light.  Neck:     Musculoskeletal: Normal range of motion and neck supple. No neck rigidity.  Cardiovascular:     Rate and Rhythm: Regular rhythm. Tachycardia present.     Pulses: Normal pulses.          Radial pulses are 2+ on the right side and 2+ on the left side.       Posterior tibial pulses are 2+ on the right side and 2+ on the left side.     Heart sounds: Normal heart sounds.     Comments: Tactile temperature in the extremities appropriate and equal bilaterally. Mildly tachycardic. Pulmonary:      Effort: Pulmonary effort is normal. No respiratory distress.     Breath sounds: Normal breath sounds.  Abdominal:     Palpations: Abdomen is soft.     Tenderness: There is abdominal tenderness in the left lower quadrant. There is no guarding.     Comments: Most of the tenderness in the abdomen is located in the left lower quadrant.  Musculoskeletal:     Right lower leg: No edema.     Left lower leg: No edema.     Comments: Tenderness to left cervical musculature into the left trapezius.  Lymphadenopathy:     Cervical: No cervical adenopathy.  Skin:    General: Skin is warm and dry.  Neurological:     Mental Status: He is alert and oriented to person,  place, and time.     Comments: Sensation grossly intact to light touch in the extremities.  Grip strengths equal bilaterally.  Strength 5/5 in all extremities. No gait disturbance. Coordination intact. Cranial nerves III-XII grossly intact. No facial droop.   Psychiatric:        Mood and Affect: Mood and affect normal.        Speech: Speech normal.        Behavior: Behavior normal.      ED Treatments / Results  Labs (all labs ordered are listed, but only abnormal results are displayed) Labs Reviewed  COMPREHENSIVE METABOLIC PANEL - Abnormal; Notable for the following components:      Result Value   Sodium 131 (*)    CO2 21 (*)    Glucose, Bld 103 (*)    Calcium 8.6 (*)    Albumin 3.1 (*)    AST 127 (*)    ALT 305 (*)    Alkaline Phosphatase 191 (*)    All other components within normal limits  CBC - Abnormal; Notable for the following components:   WBC 14.8 (*)    HCT 38.8 (*)    All other components within normal limits  URINALYSIS, ROUTINE W REFLEX MICROSCOPIC - Abnormal; Notable for the following components:   Hgb urine dipstick SMALL (*)    All other components within normal limits  C-REACTIVE PROTEIN - Abnormal; Notable for the following components:   CRP 4.4 (*)    All other components within normal limits  CBC  WITH DIFFERENTIAL/PLATELET - Abnormal; Notable for the following components:   WBC 13.4 (*)    Hemoglobin 12.7 (*)    HCT 38.1 (*)    Neutro Abs 8.7 (*)    All other components within normal limits  GASTROINTESTINAL PANEL BY PCR, STOOL (REPLACES STOOL CULTURE)  C DIFFICILE QUICK SCREEN W PCR REFLEX  CULTURE, BLOOD (ROUTINE X 2)  CULTURE, BLOOD (ROUTINE X 2)  LIPASE, BLOOD  SEDIMENTATION RATE  PROTIME-INR  LACTIC ACID, PLASMA  LACTIC ACID, PLASMA    EKG None  Radiology No results found.  Procedures Procedures (including critical care time)  Medications Ordered in ED Medications  sodium chloride flush (NS) 0.9 % injection 3 mL (3 mLs Intravenous Given 05/29/18 1204)  sodium chloride 0.9 % bolus 1,000 mL (0 mLs Intravenous Stopped 05/29/18 1507)     Initial Impression / Assessment and Plan / ED Course  I have reviewed the triage vital signs and the nursing notes.  Pertinent labs & imaging results that were available during my care of the patient were reviewed by me and considered in my medical decision making (see chart for details).        Patient presents with a variety of complaints, however, it seems as though the complaint that concerns him to the most is his persistent fever and now abdominal discomfort. Patient is nontoxic appearing, afebrile, not tachypneic, not hypotensive, maintains excellent SPO2 on room air. Mildly tachycardic during my exam.  Mild leukocytosis of 14.8.  No lactic acidosis. Slightly decreased CO2 which I suspect is due to dehydration. His AST, ALT, and alk phos are all elevated, however, patient has no pain in the upper abdomen nor does he have tenderness in this region. CRP mildly elevated at 4.4.   End of shift patient care handoff report given to Armstead Peaks, PA-C. Plan: Chest x-ray and CT abdomen/pelvis pending.  Will likely have to consult GI regarding at least the patient's elevated LFTs.  Findings  and plan of care discussed with Virgel Manifold, MD.   Vitals:   05/29/18 1330 05/29/18 1345 05/29/18 1405 05/29/18 1500  BP: 110/72 105/66 110/72 116/71  Pulse: 97 94  (!) 107  Resp:   16 18  Temp:      TempSrc:      SpO2: 100% 100% 100% 100%     Final Clinical Impressions(s) / ED Diagnoses   Final diagnoses:  None    ED Discharge Orders    None       Layla Maw 05/29/18 1648    Virgel Manifold, MD 05/30/18 1219

## 2018-05-29 NOTE — ED Provider Notes (Addendum)
Assumed care from PA Law at shift change.  See prior notes for full H&P.  Briefly, 38 y.o. M with history of ulcerative colitis.  He has had intermittent headache and fever for approx 2 weeks  Started taking motrin for headache which subsequently caused diarrhea.  Evaluation from abdominal standpoint has been reassuring-- some elevation in LFT's.  GI was consulted, if admitted do solu-medrol  BID, if d/c prednisone taper for 4 weeks (40-30-20-10) and can follow-up in clinic.  Patient treated here with tylenol and lidoderm patch and has improved.  Plan:  MRI's pending.  Follow up and reassess.  Results for orders placed or performed during the hospital encounter of 05/29/18  C Difficile Quick Screen w PCR reflex  Result Value Ref Range   C Diff antigen NEGATIVE NEGATIVE   C Diff toxin NEGATIVE NEGATIVE   C Diff interpretation No C. difficile detected.   SARS Coronavirus 2 (CEPHEID- Performed in [facility] hospital lab), [facility] Order  Result Value Ref Range   SARS Coronavirus 2 NEGATIVE NEGATIVE  Lipase, blood  Result Value Ref Range   Lipase 37 11 - 51 U/L  Comprehensive metabolic panel  Result Value Ref Range   Sodium 131 (L) 135 - 145 mmol/L   Potassium 4.1 3.5 - 5.1 mmol/L   Chloride 99 98 - 111 mmol/L   CO2 21 (L) 22 - 32 mmol/L   Glucose, Bld 103 (H) 70 - 99 mg/dL   BUN 9 6 - 20 mg/dL   Creatinine, Ser 1.61 0.61 - 1.24 mg/dL   Calcium 8.6 (L) 8.9 - 10.3 mg/dL   Total Protein 6.6 6.5 - 8.1 g/dL   Albumin 3.1 (L) 3.5 - 5.0 g/dL   AST 096 (H) 15 - 41 U/L   ALT 305 (H) 0 - 44 U/L   Alkaline Phosphatase 191 (H) 38 - 126 U/L   Total Bilirubin 0.7 0.3 - 1.2 mg/dL   GFR calc non Af Amer >60 >60 mL/min   GFR calc Af Amer >60 >60 mL/min   Anion gap 11 5 - 15  CBC  Result Value Ref Range   WBC 14.8 (H) 4.0 - 10.5 K/uL   RBC 4.67 4.22 - 5.81 MIL/uL   Hemoglobin 13.1 13.0 - 17.0 g/dL   HCT 04.5 (L) 40.9 - 81.1 %   MCV 83.1 80.0 - 100.0 fL   MCH 28.1 26.0 - 34.0 pg   MCHC 33.8  30.0 - 36.0 g/dL   RDW 91.4 78.2 - 95.6 %   Platelets 327 150 - 400 K/uL   nRBC 0.0 0.0 - 0.2 %  Urinalysis, Routine w reflex microscopic  Result Value Ref Range   Color, Urine YELLOW YELLOW   APPearance CLEAR CLEAR   Specific Gravity, Urine 1.005 1.005 - 1.030   pH 8.0 5.0 - 8.0   Glucose, UA NEGATIVE NEGATIVE mg/dL   Hgb urine dipstick SMALL (A) NEGATIVE   Bilirubin Urine NEGATIVE NEGATIVE   Ketones, ur NEGATIVE NEGATIVE mg/dL   Protein, ur NEGATIVE NEGATIVE mg/dL   Nitrite NEGATIVE NEGATIVE   Leukocytes,Ua NEGATIVE NEGATIVE   RBC / HPF 0-5 0 - 5 RBC/hpf   WBC, UA 0-5 0 - 5 WBC/hpf   Bacteria, UA NONE SEEN NONE SEEN  Sedimentation rate  Result Value Ref Range   Sed Rate 15 0 - 16 mm/hr  Protime-INR  Result Value Ref Range   Prothrombin Time 14.7 11.4 - 15.2 seconds   INR 1.2 0.8 - 1.2  Lactic acid, plasma  Result Value Ref Range   Lactic Acid, Venous 1.4 0.5 - 1.9 mmol/L  C-reactive protein  Result Value Ref Range   CRP 4.4 (H) <1.0 mg/dL  CBC with Differential/Platelet  Result Value Ref Range   WBC 13.4 (H) 4.0 - 10.5 K/uL   RBC 4.63 4.22 - 5.81 MIL/uL   Hemoglobin 12.7 (L) 13.0 - 17.0 g/dL   HCT 16.1 (L) 09.6 - 04.5 %   MCV 82.3 80.0 - 100.0 fL   MCH 27.4 26.0 - 34.0 pg   MCHC 33.3 30.0 - 36.0 g/dL   RDW 40.9 81.1 - 91.4 %   Platelets 335 150 - 400 K/uL   nRBC 0.0 0.0 - 0.2 %   Neutrophils Relative % 65 %   Neutro Abs 8.7 (H) 1.7 - 7.7 K/uL   Lymphocytes Relative 28 %   Lymphs Abs 3.8 0.7 - 4.0 K/uL   Monocytes Relative 6 %   Monocytes Absolute 0.8 0.1 - 1.0 K/uL   Eosinophils Relative 0 %   Eosinophils Absolute 0.0 0.0 - 0.5 K/uL   Basophils Relative 1 %   Basophils Absolute 0.1 0.0 - 0.1 K/uL   WBC Morphology See Note    nRBC 0 0 /100 WBC   Abs Immature Granulocytes 0.00 0.00 - 0.07 K/uL   Dg Chest 2 View  Result Date: 05/29/2018 CLINICAL DATA:  Shortness of breath EXAM: CHEST - 2 VIEW COMPARISON:  None. FINDINGS: Lungs are clear. Heart size and  pulmonary vascularity are normal. No adenopathy. No bone lesions. IMPRESSION: No edema or consolidation. Electronically Signed   By: Bretta Bang III M.D.   On: 05/29/2018 17:41   Ct Head Wo Contrast  Result Date: 05/19/2018 CLINICAL DATA:  38 year old male with acute headache, dizziness, blurred vision and nausea for 2 days. EXAM: CT HEAD WITHOUT CONTRAST TECHNIQUE: Contiguous axial images were obtained from the base of the skull through the vertex without intravenous contrast. COMPARISON:  None. FINDINGS: Brain: No evidence of acute infarction, hemorrhage, hydrocephalus, extra-axial collection or mass lesion/mass effect. Vascular: No hyperdense vessel or unexpected calcification. Skull: Normal. Negative for fracture or focal lesion. Sinuses/Orbits: No acute finding. Other: None. IMPRESSION: Unremarkable noncontrast head CT. Electronically Signed   By: Harmon Pier M.D.   On: 05/19/2018 17:49   Mr Shirlee Latch NW Contrast  Result Date: 05/30/2018 CLINICAL DATA:  38 y/o M; Throbbing headache on the left extending to the back of head into the left trapezius. EXAM: MRI HEAD WITH CONTRAST MRA NECK WITHOUT AND WITH CONTRAST MRA HEAD WITHOUT AND WITH CONTRAST MRI CERVICAL SPINE WITHOUT CONTRAST TECHNIQUE: Multiplanar, multiecho pulse sequences of the brain and surrounding structures were obtained with intravenous contrast. Angiographic images of the neck were obtained using MRA technique with and without intravenous contrast. Carotid stenosis measurements (when applicable) are obtained utilizing NASCET criteria, using the distal internal carotid diameter as the denominator. Angiographic images of the head were obtained using MRA technique without intravenous contrast. Multiplanar, multiecho pulse sequences of the cervical spine, to include the craniocervical junction and cervicothoracic junction, were obtained without intravenous contrast. CONTRAST:  8.55mL MULTIHANCE GADOBENATE DIMEGLUMINE 529 MG/ML IV SOLN  COMPARISON:  05/19/2018 CT head. FINDINGS: MRI HEAD FINDINGS Brain: No acute infarction, hemorrhage, hydrocephalus, extra-axial collection or mass lesion. Vascular: Normal flow voids. Skull and upper cervical spine: Normal marrow signal. Sinuses/Orbits: Negative. Other: None. MRA NECK FINDINGDS Aortic arch: Patent. Right common carotid artery: Patent. Right internal carotid artery: Patent. Right vertebral artery: Patent. Left common carotid artery: Patent.  Left Internal carotid artery: Patent. Left Vertebral artery: Patent. There is no evidence of hemodynamically significant stenosis by NASCET criteria, occlusion, or aneurysm. MRA HEAD FINDINGDS Internal carotid arteries:  Patent. Anterior cerebral arteries:  Patent. Middle cerebral arteries: Patent. Anterior communicating artery: Patent. Posterior communicating arteries:  Patent. Posterior cerebral arteries:  Patent. Basilar artery:  Patent. Vertebral arteries:  Patent. No evidence of high-grade stenosis, large vessel occlusion, or aneurysm. MRI CERVICAL SPINE FINDINGS Alignment: Physiologic. Vertebrae: No fracture, evidence of discitis, or bone lesion. Cord: Normal signal and morphology. Posterior Fossa, vertebral arteries, paraspinal tissues: Negative. Disc levels: No significant disc displacement, foraminal stenosis, or canal stenosis. IMPRESSION: 1. No acute intracranial abnormality identified. Unremarkable MRI brain. 2. No acute osseous or cord signal abnormality of the cervical spine. No significant cervical spine degenerative changes. 3. Patent carotid and vertebral arteries. No dissection, aneurysm, or hemodynamically significant stenosis utilizing NASCET criteria. 4. Patent anterior and posterior intracranial circulation. No large vessel occlusion, aneurysm, or stenosis. Electronically Signed   By: Mitzi Hansen M.D.   On: 05/30/2018 00:54   Mr Angiogram Neck W Or Wo Contrast  Result Date: 05/30/2018 CLINICAL DATA:  38 y/o M; Throbbing  headache on the left extending to the back of head into the left trapezius. EXAM: MRI HEAD WITH CONTRAST MRA NECK WITHOUT AND WITH CONTRAST MRA HEAD WITHOUT AND WITH CONTRAST MRI CERVICAL SPINE WITHOUT CONTRAST TECHNIQUE: Multiplanar, multiecho pulse sequences of the brain and surrounding structures were obtained with intravenous contrast. Angiographic images of the neck were obtained using MRA technique with and without intravenous contrast. Carotid stenosis measurements (when applicable) are obtained utilizing NASCET criteria, using the distal internal carotid diameter as the denominator. Angiographic images of the head were obtained using MRA technique without intravenous contrast. Multiplanar, multiecho pulse sequences of the cervical spine, to include the craniocervical junction and cervicothoracic junction, were obtained without intravenous contrast. CONTRAST:  8.46mL MULTIHANCE GADOBENATE DIMEGLUMINE 529 MG/ML IV SOLN COMPARISON:  05/19/2018 CT head. FINDINGS: MRI HEAD FINDINGS Brain: No acute infarction, hemorrhage, hydrocephalus, extra-axial collection or mass lesion. Vascular: Normal flow voids. Skull and upper cervical spine: Normal marrow signal. Sinuses/Orbits: Negative. Other: None. MRA NECK FINDINGDS Aortic arch: Patent. Right common carotid artery: Patent. Right internal carotid artery: Patent. Right vertebral artery: Patent. Left common carotid artery: Patent. Left Internal carotid artery: Patent. Left Vertebral artery: Patent. There is no evidence of hemodynamically significant stenosis by NASCET criteria, occlusion, or aneurysm. MRA HEAD FINDINGDS Internal carotid arteries:  Patent. Anterior cerebral arteries:  Patent. Middle cerebral arteries: Patent. Anterior communicating artery: Patent. Posterior communicating arteries:  Patent. Posterior cerebral arteries:  Patent. Basilar artery:  Patent. Vertebral arteries:  Patent. No evidence of high-grade stenosis, large vessel occlusion, or aneurysm. MRI  CERVICAL SPINE FINDINGS Alignment: Physiologic. Vertebrae: No fracture, evidence of discitis, or bone lesion. Cord: Normal signal and morphology. Posterior Fossa, vertebral arteries, paraspinal tissues: Negative. Disc levels: No significant disc displacement, foraminal stenosis, or canal stenosis. IMPRESSION: 1. No acute intracranial abnormality identified. Unremarkable MRI brain. 2. No acute osseous or cord signal abnormality of the cervical spine. No significant cervical spine degenerative changes. 3. Patent carotid and vertebral arteries. No dissection, aneurysm, or hemodynamically significant stenosis utilizing NASCET criteria. 4. Patent anterior and posterior intracranial circulation. No large vessel occlusion, aneurysm, or stenosis. Electronically Signed   By: Mitzi Hansen M.D.   On: 05/30/2018 00:54   Mr Brain Wo Contrast  Result Date: 05/30/2018 CLINICAL DATA:  38 y/o M; Throbbing headache on the left extending to  the back of head into the left trapezius. EXAM: MRI HEAD WITH CONTRAST MRA NECK WITHOUT AND WITH CONTRAST MRA HEAD WITHOUT AND WITH CONTRAST MRI CERVICAL SPINE WITHOUT CONTRAST TECHNIQUE: Multiplanar, multiecho pulse sequences of the brain and surrounding structures were obtained with intravenous contrast. Angiographic images of the neck were obtained using MRA technique with and without intravenous contrast. Carotid stenosis measurements (when applicable) are obtained utilizing NASCET criteria, using the distal internal carotid diameter as the denominator. Angiographic images of the head were obtained using MRA technique without intravenous contrast. Multiplanar, multiecho pulse sequences of the cervical spine, to include the craniocervical junction and cervicothoracic junction, were obtained without intravenous contrast. CONTRAST:  8.313mL MULTIHANCE GADOBENATE DIMEGLUMINE 529 MG/ML IV SOLN COMPARISON:  05/19/2018 CT head. FINDINGS: MRI HEAD FINDINGS Brain: No acute infarction,  hemorrhage, hydrocephalus, extra-axial collection or mass lesion. Vascular: Normal flow voids. Skull and upper cervical spine: Normal marrow signal. Sinuses/Orbits: Negative. Other: None. MRA NECK FINDINGDS Aortic arch: Patent. Right common carotid artery: Patent. Right internal carotid artery: Patent. Right vertebral artery: Patent. Left common carotid artery: Patent. Left Internal carotid artery: Patent. Left Vertebral artery: Patent. There is no evidence of hemodynamically significant stenosis by NASCET criteria, occlusion, or aneurysm. MRA HEAD FINDINGDS Internal carotid arteries:  Patent. Anterior cerebral arteries:  Patent. Middle cerebral arteries: Patent. Anterior communicating artery: Patent. Posterior communicating arteries:  Patent. Posterior cerebral arteries:  Patent. Basilar artery:  Patent. Vertebral arteries:  Patent. No evidence of high-grade stenosis, large vessel occlusion, or aneurysm. MRI CERVICAL SPINE FINDINGS Alignment: Physiologic. Vertebrae: No fracture, evidence of discitis, or bone lesion. Cord: Normal signal and morphology. Posterior Fossa, vertebral arteries, paraspinal tissues: Negative. Disc levels: No significant disc displacement, foraminal stenosis, or canal stenosis. IMPRESSION: 1. No acute intracranial abnormality identified. Unremarkable MRI brain. 2. No acute osseous or cord signal abnormality of the cervical spine. No significant cervical spine degenerative changes. 3. Patent carotid and vertebral arteries. No dissection, aneurysm, or hemodynamically significant stenosis utilizing NASCET criteria. 4. Patent anterior and posterior intracranial circulation. No large vessel occlusion, aneurysm, or stenosis. Electronically Signed   By: Mitzi HansenLance  Furusawa-Stratton M.D.   On: 05/30/2018 00:54   Mr Cervical Spine Wo Contrast  Result Date: 05/30/2018 CLINICAL DATA:  38 y/o M; Throbbing headache on the left extending to the back of head into the left trapezius. EXAM: MRI HEAD WITH  CONTRAST MRA NECK WITHOUT AND WITH CONTRAST MRA HEAD WITHOUT AND WITH CONTRAST MRI CERVICAL SPINE WITHOUT CONTRAST TECHNIQUE: Multiplanar, multiecho pulse sequences of the brain and surrounding structures were obtained with intravenous contrast. Angiographic images of the neck were obtained using MRA technique with and without intravenous contrast. Carotid stenosis measurements (when applicable) are obtained utilizing NASCET criteria, using the distal internal carotid diameter as the denominator. Angiographic images of the head were obtained using MRA technique without intravenous contrast. Multiplanar, multiecho pulse sequences of the cervical spine, to include the craniocervical junction and cervicothoracic junction, were obtained without intravenous contrast. CONTRAST:  8.733mL MULTIHANCE GADOBENATE DIMEGLUMINE 529 MG/ML IV SOLN COMPARISON:  05/19/2018 CT head. FINDINGS: MRI HEAD FINDINGS Brain: No acute infarction, hemorrhage, hydrocephalus, extra-axial collection or mass lesion. Vascular: Normal flow voids. Skull and upper cervical spine: Normal marrow signal. Sinuses/Orbits: Negative. Other: None. MRA NECK FINDINGDS Aortic arch: Patent. Right common carotid artery: Patent. Right internal carotid artery: Patent. Right vertebral artery: Patent. Left common carotid artery: Patent. Left Internal carotid artery: Patent. Left Vertebral artery: Patent. There is no evidence of hemodynamically significant stenosis by NASCET criteria, occlusion, or  aneurysm. MRA HEAD FINDINGDS Internal carotid arteries:  Patent. Anterior cerebral arteries:  Patent. Middle cerebral arteries: Patent. Anterior communicating artery: Patent. Posterior communicating arteries:  Patent. Posterior cerebral arteries:  Patent. Basilar artery:  Patent. Vertebral arteries:  Patent. No evidence of high-grade stenosis, large vessel occlusion, or aneurysm. MRI CERVICAL SPINE FINDINGS Alignment: Physiologic. Vertebrae: No fracture, evidence of discitis,  or bone lesion. Cord: Normal signal and morphology. Posterior Fossa, vertebral arteries, paraspinal tissues: Negative. Disc levels: No significant disc displacement, foraminal stenosis, or canal stenosis. IMPRESSION: 1. No acute intracranial abnormality identified. Unremarkable MRI brain. 2. No acute osseous or cord signal abnormality of the cervical spine. No significant cervical spine degenerative changes. 3. Patent carotid and vertebral arteries. No dissection, aneurysm, or hemodynamically significant stenosis utilizing NASCET criteria. 4. Patent anterior and posterior intracranial circulation. No large vessel occlusion, aneurysm, or stenosis. Electronically Signed   By: Mitzi Hansen M.D.   On: 05/30/2018 00:54   Ct Abdomen Pelvis W Contrast  Result Date: 05/29/2018 CLINICAL DATA:  Lower abdominal pain with diarrhea EXAM: CT ABDOMEN AND PELVIS WITH CONTRAST TECHNIQUE: Multidetector CT imaging of the abdomen and pelvis was performed using the standard protocol following bolus administration of intravenous contrast. CONTRAST:  OMNIPAQUE IOHEXOL 300 MG/ML  SOLN COMPARISON:  None. FINDINGS: Lower chest: There is an 8 x 7 mm nodular opacity in the medial aspect of the posterior segment left lower lobe, seen on axial slice 31 series 5. No edema or consolidation is evident in the lung base regions. Hepatobiliary: No focal liver lesions are appreciable. The gallbladder wall is not appreciably thickened. There is no appreciable biliary duct dilatation. Pancreas: There is no pancreatic mass or inflammatory focus. Spleen: No splenic lesions are evident. Small accessory spleens are noted anteriorly. Adrenals/Urinary Tract: Adrenals bilaterally appear unremarkable. There are multiple cysts throughout the left kidney. The largest cyst is in the upper pole region measuring 4.0 x 3.8 cm. Several small cysts are noted in the right kidney. There is no appreciable hydronephrosis on either side. There is a 4 x  3 mm calculus in the lower pole of the right kidney. There is no appreciable ureteral calculus on either side. Urinary bladder is midline with wall thickness slightly increased. Stomach/Bowel: There is wall thickening throughout most of the colon from the cecum to the proximal sigmoid colon, consistent with colitis. No small bowel wall thickening is demonstrable. There is no evident bowel obstruction. Terminal ileum appears within normal limits. No free air or portal venous air is evident. Vascular/Lymphatic: No abdominal aortic aneurysm. No vascular lesions are evident. There is no adenopathy in the abdomen or pelvis. There are scattered subcentimeter mesenteric lymph nodes which may be of reactive etiology given the colitis. Reproductive: Prostate and seminal vesicles appear normal in size and contour. There is no evident pelvic mass. Other: Appendix appears normal. There is no evident abscess in the abdomen or pelvis. Musculoskeletal: There are no blastic or lytic bone lesions. No intramuscular or abdominal wall lesions are evident. IMPRESSION: 1. Colitis involving most of the colon with sparing of a portion of the sigmoid colon. Etiology for this colitis is uncertain. No bowel obstruction. Terminal ileum appears unremarkable. Small bowel appears unremarkable. 2.  Appendix appears normal.  No abscess in the abdomen or pelvis. 3. Mild urinary bladder wall thickening evident, consistent with a degree of cystitis. 4. Nonobstructing calculus lower pole right kidney measuring 4 x 3 mm. 5. 8 x 7 mm nodular opacity medial, posterior right base. Non-contrast chest  CT at 6-12 months is recommended. If the nodule is stable at time of repeat CT, then future CT at 18-24 months (from today's scan) is considered optional for low-risk patients, but is recommended for high-risk patients. This recommendation follows the consensus statement: Guidelines for Management of Incidental Pulmonary Nodules Detected on CT Images: From the  Fleischner Society 2017; Radiology 2017; 284:228-243. Electronically Signed   By: Bretta Bang III M.D.   On: 05/29/2018 18:30    1:17 AM Patient's MRI's are all negative.  On repeat assessment he is resting comfortably.  When awoken, states he is feeling better after tylenol and lidocaine patch.  He is able to fully range his neck without difficulty, does report some pain when turning to the left and states it feels "tense" but better than before.  He does not have any nuchal rigidity.  He is neurologically intact without any apparent focal deficits, thought process is clear and concise, no recent seizures.  He is not immunocompromised, no history of HIV.  At this point, I have very low suspicion that this represents acute bacterial meningitis given his overall reassuring exam.  This could represent simple viral process.  He also has history of tension headaches documented in chart from ED visit 05/19/18. Given his overall reassuring exam, I am not 100% convinced he needs LP as I do not suspect bacterial meningitis so I am not sure this will change our management emergently.  I discussed options of LP to patient along with risks/benefits, he honestly does not want it.  I have discussed with case with neurology, Dr. Laurence Slate, including labs and the numerous imaging studies-- he agrees, does not feel like any further testing needs to be done emergently as bacterial meningitis with rather benign exam would be very unlikely and would be unlikely to improve with tylenol and lidoderm patch.  He recommends symptomatic treatment, follow-up with neurology as an OP.  1:34 AM I have spoken with patient and his his wife listening in via speaker phone-- she is highly upset, cursing, threatening to sue to the hospital and staff because we do not have a diagnosis and we are "just sending him home because we are out of ideas".  I discussed with her his extensive work-up along with findings, consults made, and the thought  process of myself, previous providers, as well as neurology.  She is upset that neurology has not seen patient in person, however I am not sure that will change management in the ER.  Attending physician, Dr. Wilkie Aye, to evaluate.  1:59 AM Dr. Wilkie Aye has evaluated-- patient has informed her he has been hiking with kids.  This was not mentioned to prior providers.  He has not seen any ticks on him, bites, or noted rash.  Tick borne illness considered.  I also see that he recently stopped drinking alcohol in April based on prior notes.  He does have some elevations in LFT's which could be from either of these.  We will send RMSF titers, start doxycycline.  Follow-up with [facility]-- ambulatory referral sent.  Patient was advised of signs/symptoms that would warrant ED return.  He was discharged home in stable condition.   Garlon Hatchet, PA-C 05/30/18 0410    Garlon Hatchet, PA-C 05/30/18 0411    Shon Baton, MD 05/30/18 [phone]

## 2018-05-29 NOTE — ED Provider Notes (Signed)
Medical screening examination/treatment/procedure(s) were conducted as a shared visit with non-physician practitioner(s) and myself.  I personally evaluated the patient during the encounter.  None  Has had extensive evaluation with a prior visit.  However, a significant portion of his symptoms are a fairly unilateral but severe headache and neck pain on the left.  He has had recurrent fever and malaise.  Picture was also complicated by history of ulcerative colitis and some diarrheal illness.  I did perform extensive head neck exam and did not find focal anomalies to suggest HSV, otitis media or externa or other evident focal infectious neck findings.  His mental status is clear.  He does not have meningismus.  There was discussion as to whether this patient needed LP due to fevers and headaches with neck pain.  After doing an extensive evaluation, I did not feel comfortable proceeding with lumbar puncture given the headache and neck pain were so localized to the left side of the neck and head.  With long course of symptoms and unilateral pain, I felt that MRI was first indicated to rule out any potential anatomical or space-occupying lesion or epidural infectious process possibly discitis.  I felt very low probability that a bacterial meningitis would have this indolent course.  Patient is otherwise stable for continued diagnostic evaluation and management based on any positive findings.   Arby Barrette, MD 06/06/18 1011

## 2018-05-29 NOTE — ED Provider Notes (Signed)
Signout from previous provider, Harolyn RutherfordShawn Joy, PA-C at shift change See previous providers note for full H&P  Briefly, patient with history of ulcerative colitis presents with a 16-day history of headache and 10-day history of watery diarrhea.  Patient has had 4-8 episodes of diarrhea.  He reports he started with a headache which she describes as coming up from his neck over his head into his eyes.  He describes shooting pains going down his spine.  He has had fever at home for which she has been taking ibuprofen.  He feels like maybe his diarrhea and colitis flare came from the ibuprofen.  Patient had a negative COVID-19 test recently.  Patient found to have elevated LFTs and leukocytosis.  At shift change, CT abdomen pelvis and inflammatory markers are pending.  CT abdomen pelvis shows colitis involving most of the colon; mild urinary bladder wall thickening; nonobstructing calculus in the lower pole right kidney; 8 x 7 mm nodular opacity in the posterior right lung base. Physical Exam  BP 116/71   Pulse (!) 107   Temp (!) 101.5 F (38.6 C) (Rectal)   Resp 18   Ht 6\' 1"  (1.854 m)   Wt 83.9 kg   SpO2 100%   BMI 24.41 kg/m   Physical Exam Vitals signs and nursing note reviewed.  Constitutional:      General: He is not in acute distress.    Appearance: He is well-developed. He is not diaphoretic.  HENT:     Head: Normocephalic and atraumatic.     Right Ear: Tympanic membrane normal.     Left Ear: Tympanic membrane normal.     Mouth/Throat:     Pharynx: No oropharyngeal exudate.  Eyes:     General: No scleral icterus.       Right eye: No discharge.        Left eye: No discharge.     Conjunctiva/sclera: Conjunctivae normal.     Pupils: Pupils are equal, round, and reactive to light.  Neck:     Musculoskeletal: Normal range of motion and neck supple.     Thyroid: No thyromegaly.     Comments: Patient has significant headache when he moves his chin to chest and head to the  right Cardiovascular:     Rate and Rhythm: Normal rate and regular rhythm.     Heart sounds: Normal heart sounds. No murmur. No friction rub. No gallop.   Pulmonary:     Effort: Pulmonary effort is normal. Tachypnea present. No respiratory distress.     Breath sounds: Normal breath sounds. No stridor. No wheezing or rales.  Abdominal:     General: Bowel sounds are normal. There is no distension.     Palpations: Abdomen is soft.     Tenderness: There is no abdominal tenderness. There is no guarding or rebound.  Lymphadenopathy:     Cervical: No cervical adenopathy.  Skin:    General: Skin is warm and dry.     Coloration: Skin is not pale.     Findings: No rash.  Neurological:     Mental Status: He is alert.     Coordination: Coordination normal.     ED Course/Procedures     Procedures  MDM  Patient with headache for 16 days as well as diarrhea for 10 days.  He has had fever for about 10 days as well.  CT abdomen pelvis shows colitis.  Repeat COVID test was negative.  Patient has mild leukocytosis.  CRP is mildly elevated  at 4.4.  Sed rate is within normal limits.  Lactic acid is negative.  It appears patient started with a headache and begin taking ibuprofen.  Suspect this caused his ulcerative colitis flareup.  I discussed patient case with gastroenterologist on-call, Dr. Levora Angel, who advised steroids (60 mg Solu-Medrol BID if admitted and for week prednisone taper if discharged starting with 40 mg and tapering 10 mg down each week).  On ulcerative colitis flare does not explain patient's fever and headache, however.  Concern for some type of vasculitis or discitis.  Low suspicion of bacterial meningitis at this point, however viral or another type of meningitis is on the differential if MRI is negative.  Patient evaluated my attending Dr. Donnald Garre, who advised MR brain, MRA head and neck, and MR C-spine. This is pending at shift change.  Care transferred to Sharilyn Sites, PA-C, at  shift change.   Emi Holes, PA-C 05/29/18 [id number]    Arby Barrette, MD 06/06/18 1012

## 2018-05-29 NOTE — ED Notes (Signed)
Patient transported to CT 

## 2018-05-29 NOTE — ED Triage Notes (Addendum)
State was taking a lot of motrin for h/a and now he thinks that that haas messed with his ulcerative colitis, has a lot of diarrhea  spoke to his GP on Thursday and given tramdol  Helped some but states he has sweats haD NEG covid  RESULTS  YESTERDAY

## 2018-05-30 ENCOUNTER — Telehealth (HOSPITAL_BASED_OUTPATIENT_CLINIC_OR_DEPARTMENT_OTHER): Payer: Self-pay | Admitting: *Deleted

## 2018-05-30 DIAGNOSIS — R51 Headache: Secondary | ICD-10-CM | POA: Diagnosis not present

## 2018-05-30 LAB — GASTROINTESTINAL PANEL BY PCR, STOOL (REPLACES STOOL CULTURE)

## 2018-05-30 LAB — BLOOD CULTURE ID PANEL (REFLEXED)

## 2018-05-30 MED ORDER — GADOBENATE DIMEGLUMINE 529 MG/ML IV SOLN
8.3000 mL | Freq: Once | INTRAVENOUS | Status: AC | PRN
  Administered 2018-05-30: 8.3 mL via INTRAVENOUS

## 2018-05-30 MED ORDER — PREDNISONE 10 MG PO TABS: ORAL_TABLET | ORAL | 0 refills | Status: AC

## 2018-05-30 MED ORDER — DOXYCYCLINE HYCLATE 100 MG PO TABS
100.0000 mg | ORAL_TABLET | Freq: Once | ORAL | Status: AC
  Administered 2018-05-30: 03:00:00 100 mg via ORAL
  Filled 2018-05-30: qty 1

## 2018-05-30 MED ORDER — KETOROLAC TROMETHAMINE 30 MG/ML IJ SOLN
30.0000 mg | Freq: Once | INTRAMUSCULAR | Status: AC
  Administered 2018-05-30: 30 mg via INTRAVENOUS
  Filled 2018-05-30: qty 1

## 2018-05-30 MED ORDER — DOXYCYCLINE HYCLATE 100 MG PO CAPS: 100.0000 mg | ORAL_CAPSULE | Freq: Two times a day (BID) | ORAL | 0 refills | Status: AC

## 2018-05-30 NOTE — Discharge Instructions (Addendum)
Take the prescribed medication as directed.   Follow-up with neurology-- I have sent a referral to [facility], they should contact you for appt but I have attached their office information as well. Follow-up with your GI doctor as well. Return to the ED for new or worsening symptoms.

## 2018-06-01 LAB — CULTURE, BLOOD (ROUTINE X 2): Special Requests: ADEQUATE

## 2018-06-01 LAB — ROCKY MTN SPOTTED FVR ABS PNL(IGG+IGM)
RMSF IgG: NEGATIVE
RMSF IgM: 0.33 index (ref 0.00–0.89)

## 2018-06-03 LAB — CULTURE, BLOOD (ROUTINE X 2)
Culture: NO GROWTH
Special Requests: ADEQUATE

## 2018-06-06 ENCOUNTER — Telehealth: Payer: Self-pay | Admitting: Diagnostic Neuroimaging

## 2018-06-06 NOTE — Telephone Encounter (Signed)
Pt gave consent for VV on the phone/ Pt understands that although there may be some limitations with this type of visit, we will take all precautions to reduce any security or privacy concerns.  Pt understands that this will be treated like an in office visit and we will file with pt's insurance, and there may be a patient responsible charge related to this service. Sent e-mail with provider's link to [email or web address]

## 2018-06-11 ENCOUNTER — Encounter: Payer: Self-pay | Admitting: Diagnostic Neuroimaging

## 2018-06-11 NOTE — Addendum Note (Signed)
Addended by: [place] Pink on: 06/11/2018 10:28 AM   Modules accepted: Orders

## 2018-06-11 NOTE — Telephone Encounter (Signed)
Spoke with patient and updated EMR. 

## 2018-06-12 ENCOUNTER — Other Ambulatory Visit: Payer: Self-pay

## 2018-06-12 ENCOUNTER — Ambulatory Visit (INDEPENDENT_AMBULATORY_CARE_PROVIDER_SITE_OTHER): Payer: BC Managed Care – PPO | Admitting: Diagnostic Neuroimaging

## 2018-06-12 ENCOUNTER — Encounter: Payer: Self-pay | Admitting: Diagnostic Neuroimaging

## 2018-06-12 DIAGNOSIS — G43009 Migraine without aura, not intractable, without status migrainosus: Secondary | ICD-10-CM

## 2018-06-12 NOTE — Progress Notes (Signed)
[facility]  PATIENT: Ian Benjamin DOB: Nov 17, 1980  REFERRING CLINICIAN: Dossie Arbour HISTORY FROM: patient  REASON FOR VISIT: new consult    HISTORICAL  CHIEF COMPLAINT:  Chief Complaint  Patient presents with  . Headache    HISTORY OF PRESENT ILLNESS:   38 year old male here for evaluation of headaches.  History of ulcerative colitis.  May 08, 2018 patient had onset of throbbing headache, starting in his neck and occipital region and spreading to the top of his head.  Patient tried ibuprofen with mild relief.  No nausea or vomiting.  Some sound sensitivity.  Headaches would last hours or days at a time.  Patient went to the emergency room on 05/19/2018 for evaluation.  Lab testing and CT of the head were unremarkable.  Patient was treated with Benadryl, Compazine, IV fluids and Lidoderm patch.  05/21/2018 patient had onset of fever, chills, malaise.  Patient had COVID-19 testing and was negative.  Patient also started to have UC flareup with multiple episodes of bloody diarrhea daily.  Patient went to the emergency room on 05/29/2018 for evaluation, for headaches, diarrhea, fatigue and shortness of breath.  Lab testing was notable for elevated LFTs, leukocytosis, hyponatremia. Patient continues on prednisone for UC. Also was started empirically for RMSF (doxycycline). Previous etoh use (2 drinks per day) but stopped 40 days ago.   Headaches are stable. Continues now with pressure HA, daily. Some mild improvement.   REVIEW OF SYSTEMS: Full 14 system review of systems performed and negative with exception of: as per HPI.  ALLERGIES: Allergies  Allergen Reactions  . Dye Fdc Red [Red Dye] Other (See Comments)    Purple Dye caused seizures as a child    HOME MEDICATIONS: Outpatient Medications Prior to Visit  Medication Sig Dispense Refill  . acetaminophen (TYLENOL) 500 MG tablet Take 500 mg by mouth every 6 (six) hours as needed.    .  butalbital-acetaminophen-caffeine (FIORICET) 50-325-40 MG tablet Take 1 tablet by mouth every 6 (six) hours as needed for headache. (Patient not taking: Reported on 05/30/2018) 5 tablet 0  . doxycycline (VIBRAMYCIN) 100 MG capsule Take 1 capsule (100 mg total) by mouth 2 (two) times daily. 28 capsule 0  . ibuprofen (ADVIL,MOTRIN) 600 MG tablet Take 1 tablet (600 mg total) by mouth every 6 (six) hours as needed. (Patient not taking: Reported on 05/30/2018) 30 tablet 0  . lidocaine (LIDODERM) 5 % Place 1 patch onto the skin daily. Place to L side of neck where your pain is daily as needed for discomfort. Remove & Discard patch within 12 hours or as directed by MD 14 patch 0  . mesalamine (LIALDA) 1.2 g EC tablet Take 1.2 g by mouth daily with breakfast.    . oxyCODONE-acetaminophen (PERCOCET/ROXICET) 5-325 MG tablet Take 1-2 tablets by mouth every 6 (six) hours as needed for severe pain. (Patient not taking: Reported on 05/30/2018) 15 tablet 0  . predniSONE (DELTASONE) 10 MG tablet Take 40 mg by mouth daily for 7 days, then  by mouth daily for 7 days, then  for 7 days, then  for 7 days 70 tablet 0   No facility-administered medications prior to visit.     PAST MEDICAL HISTORY: Past Medical History:  Diagnosis Date  . Ulcerative colitis (HCC)     PAST SURGICAL HISTORY: Past Surgical History:  Procedure Laterality Date  . COLONOSCOPY  2014  . WISDOM TOOTH EXTRACTION      FAMILY HISTORY: Family History  Problem Relation  Age of Onset  . Breast cancer Mother   . Gout Father   . Kidney Stones Father   . Diabetes Maternal Grandmother     SOCIAL HISTORY: Social History   Socioeconomic History  . Marital status: Married    Spouse name: Not on file  . Number of children: 2  . Years of education: Not on file  . Highest education level: Bachelor's degree (e.g., BA, AB, BS)  Occupational History  . Not on file  Social Needs  . Financial resource strain: Not on file  . Food  insecurity:    Worry: Not on file    Inability: Not on file  . Transportation needs:    Medical: Not on file    Non-medical: Not on file  Tobacco Use  . Smoking status: Never Smoker  . Smokeless tobacco: Never Used  Substance and Sexual Activity  . Alcohol use: Yes    Comment: Occasional social drinker  . Drug use: Never  . Sexual activity: Yes    Birth control/protection: Surgical    Comment: Vasectomy  Lifestyle  . Physical activity:    Days per week: Not on file    Minutes per session: Not on file  . Stress: Not on file  Relationships  . Social connections:    Talks on phone: Not on file    Gets together: Not on file    Attends religious service: Not on file    Active member of club or organization: Not on file    Attends meetings of clubs or organizations: Not on file    Relationship status: Not on file  . Intimate partner violence:    Fear of current or ex partner: Not on file    Emotionally abused: Not on file    Physically abused: Not on file    Forced sexual activity: Not on file  Other Topics Concern  . Not on file  Social History Narrative   Lives with family   Caffeine- coffee 2 cups daily     PHYSICAL EXAM   VIDEO EXAM  GENERAL EXAM/CONSTITUTIONAL:  Vitals: There were no vitals filed for this visit.  There is no height or weight on file to calculate BMI. Wt Readings from Last 3 Encounters:  05/29/18 185 lb (83.9 kg)  05/19/18 185 lb (83.9 kg)  03/25/18 185 lb (83.9 kg)     Patient is in no distress; well developed, nourished and groomed; neck is supple  NEUROLOGIC: MENTAL STATUS:  No flowsheet data found.  awake, alert, oriented to person, place and time  recent and remote memory intact  normal attention and concentration  language fluent, comprehension intact, naming intact  fund of knowledge appropriate  CRANIAL NERVE:   2nd, 3rd, 4th, 6th - visual fields full to confrontation, extraocular muscles intact, no nystagmus  7th -  facial strength symmetric  8th - hearing intact     DIAGNOSTIC DATA (LABS, IMAGING, TESTING) - I reviewed patient records, labs, notes, testing and imaging myself where available.  Lab Results  Component Value Date   WBC 13.4 (H) 05/29/2018   HGB 12.7 (L) 05/29/2018   HCT 38.1 (L) 05/29/2018   MCV 82.3 05/29/2018   PLT 335 05/29/2018      Component Value Date/Time   NA 131 (L) 05/29/2018 1202   K 4.1 05/29/2018 1202   CL 99 05/29/2018 1202   CO2 21 (L) 05/29/2018 1202   GLUCOSE 103 (H) 05/29/2018 1202   BUN 9 05/29/2018 1202  CREATININE 1.06 05/29/2018 1202   CALCIUM 8.6 (L) 05/29/2018 1202   PROT 6.6 05/29/2018 1202   ALBUMIN 3.1 (L) 05/29/2018 1202   AST 127 (H) 05/29/2018 1202   ALT 305 (H) 05/29/2018 1202   ALKPHOS 191 (H) 05/29/2018 1202   BILITOT 0.7 05/29/2018 1202   GFRNONAA >60 05/29/2018 1202   GFRAA >60 05/29/2018 1202   No results found for: CHOL, HDL, LDLCALC, LDLDIRECT, TRIG, CHOLHDL No results found for: TGYB6L No results found for: VITAMINB12 No results found for: TSH    05/29/18 CT A/P  1. Colitis involving most of the colon with sparing of a portion of the sigmoid colon. Etiology for this colitis is uncertain. No bowel obstruction. Terminal ileum appears unremarkable. Small bowel appears unremarkable. 2.  Appendix appears normal.  No abscess in the abdomen or pelvis. 3. Mild urinary bladder wall thickening evident, consistent with a degree of cystitis. 4. Nonobstructing calculus lower pole right kidney measuring 4 x 3 mm. 5. 8 x 7 mm nodular opacity medial, posterior right base. Non-contrast chest CT at 6-12 months is recommended. If the nodule is stable at time of repeat CT, then future CT at 18-24 months (from today's scan) is considered optional for low-risk patients, but is recommended for high-risk patients. This recommendation follows the consensus statement: Guidelines for Management of Incidental Pulmonary Nodules Detected on CT  Images: From the Fleischner Society 2017; Radiology 2017; 284:228-243.  05/29/18 MRI brain, MRA head / neck, MRI cervical spine 1. No acute intracranial abnormality identified. Unremarkable MRI brain. 2. No acute osseous or cord signal abnormality of the cervical spine. No significant cervical spine degenerative changes. 3. Patent carotid and vertebral arteries. No dissection, aneurysm, or hemodynamically significant stenosis utilizing NASCET criteria. 4. Patent anterior and posterior intracranial circulation. No large vessel occlusion, aneurysm, or stenosis.    ASSESSMENT AND PLAN  38 y.o. year old male here with:    Dx:  1. Migraine without aura and without status migrainosus, not intractable     Virtual Visit via Video Note  I connected with Ian Benjamin on 06/12/18 at  8:30 AM EDT by a video enabled telemedicine application and verified that I am speaking with the correct person using two identifiers.  Location: Patient: home Provider: office   I discussed the limitations of evaluation and management by telemedicine and the availability of in person appointments. The patient expressed understanding and agreed to proceed.   I discussed the assessment and treatment plan with the patient. The patient was provided an opportunity to ask questions and all were answered. The patient agreed with the plan and demonstrated an understanding of the instructions.   The patient was advised to call back or seek an in-person evaluation if the symptoms worsen or if the condition fails to improve as anticipated.  I provided 45 minutes of non-face-to-face time during this encounter including review of hospital records and imaging myself.     PLAN:   1. HEADACHES (? Migraine variant vs post-viral) To prevent or relieve headaches, try the following:  Cool Compress. Lie down and place a cool compress on your head.   Avoid headache triggers. If certain foods or odors seem to have  triggered your migraines in the past, avoid them. A headache diary might help you identify triggers.   Include physical activity in your daily routine.   Manage stress. Find healthy ways to cope with the stressors, such as delegating tasks on your to-do list.   Practice relaxation techniques. Try  deep breathing, yoga, massage and visualization.   Eat regularly. Eating regularly scheduled meals and maintaining a healthy diet might help prevent headaches. Also, drink plenty of fluids.   Follow a regular sleep schedule. Sleep deprivation might contribute to headaches  Consider biofeedback. With this mind-body technique, you learn to control certain bodily functions - such as muscle tension, heart rate and blood pressure - to prevent headaches or reduce headache pain. MIGRAINE PREVENTION --> consider propranolol or amiriptyline or CGRP antagonist; caution with topiramate due to kidney stone MIGRAINE RESCUE --> consider rizatriptan  as needed for breakthrough headache; may repeat x 1 after 2 hours; max 2 tabs per day or 8 per month   2. UC FLARE UP - follow up with GI  3. HYPONATREMIA / ELEVATED LFTs / ELEVATED WBCs - follow up with PCP, GI  4. Lung nodular opacity  - follow up with PCP  Return for pending if symptoms worsen or fail to improve.    Suanne Marker, MD 06/12/2018, 9:08 AM Certified in Neurology, Neurophysiology and Neuroimaging  [facility] [address] [place], [place] [address] [phone]

## 2018-07-03 DIAGNOSIS — K5190 Ulcerative colitis, unspecified, without complications: Secondary | ICD-10-CM | POA: Diagnosis not present

## 2018-07-03 DIAGNOSIS — R51 Headache: Secondary | ICD-10-CM | POA: Diagnosis not present

## 2018-07-03 DIAGNOSIS — R5383 Other fatigue: Secondary | ICD-10-CM | POA: Diagnosis not present

## 2018-07-11 DIAGNOSIS — K625 Hemorrhage of anus and rectum: Secondary | ICD-10-CM | POA: Diagnosis not present

## 2018-07-11 DIAGNOSIS — K5100 Ulcerative (chronic) pancolitis without complications: Secondary | ICD-10-CM | POA: Diagnosis not present

## 2018-07-11 DIAGNOSIS — R197 Diarrhea, unspecified: Secondary | ICD-10-CM | POA: Diagnosis not present

## 2018-08-16 DIAGNOSIS — K529 Noninfective gastroenteritis and colitis, unspecified: Secondary | ICD-10-CM | POA: Diagnosis not present

## 2018-08-16 DIAGNOSIS — K5289 Other specified noninfective gastroenteritis and colitis: Secondary | ICD-10-CM | POA: Diagnosis not present

## 2018-08-16 DIAGNOSIS — K625 Hemorrhage of anus and rectum: Secondary | ICD-10-CM | POA: Diagnosis not present

## 2018-08-16 DIAGNOSIS — R197 Diarrhea, unspecified: Secondary | ICD-10-CM | POA: Diagnosis not present

## 2018-09-27 DIAGNOSIS — K5130 Ulcerative (chronic) rectosigmoiditis without complications: Secondary | ICD-10-CM | POA: Diagnosis not present

## 2018-10-15 DIAGNOSIS — Z83438 Family history of other disorder of lipoprotein metabolism and other lipidemia: Secondary | ICD-10-CM | POA: Diagnosis not present

## 2018-10-15 DIAGNOSIS — R5383 Other fatigue: Secondary | ICD-10-CM | POA: Diagnosis not present

## 2018-10-15 DIAGNOSIS — Z0000 Encounter for general adult medical examination without abnormal findings: Secondary | ICD-10-CM | POA: Diagnosis not present

## 2018-10-15 DIAGNOSIS — Z8249 Family history of ischemic heart disease and other diseases of the circulatory system: Secondary | ICD-10-CM | POA: Diagnosis not present

## 2018-10-15 DIAGNOSIS — Z23 Encounter for immunization: Secondary | ICD-10-CM | POA: Diagnosis not present

## 2019-03-15 DIAGNOSIS — Z23 Encounter for immunization: Secondary | ICD-10-CM | POA: Diagnosis not present

## 2019-07-31 ENCOUNTER — Ambulatory Visit: Payer: Self-pay | Attending: Internal Medicine

## 2019-07-31 ENCOUNTER — Other Ambulatory Visit: Payer: Self-pay | Admitting: *Deleted

## 2019-07-31 DIAGNOSIS — Z20822 Contact with and (suspected) exposure to covid-19: Secondary | ICD-10-CM

## 2019-08-01 LAB — NOVEL CORONAVIRUS, NAA: SARS-CoV-2, NAA: NOT DETECTED

## 2019-08-01 LAB — SARS-COV-2, NAA 2 DAY TAT

## 2019-12-02 DIAGNOSIS — Z20822 Contact with and (suspected) exposure to covid-19: Secondary | ICD-10-CM | POA: Diagnosis not present

## 2020-04-29 ENCOUNTER — Other Ambulatory Visit: Payer: Self-pay

## 2020-04-29 ENCOUNTER — Encounter (HOSPITAL_COMMUNITY): Payer: Self-pay | Admitting: Emergency Medicine

## 2020-04-29 ENCOUNTER — Ambulatory Visit (HOSPITAL_COMMUNITY)
Admission: EM | Admit: 2020-04-29 | Discharge: 2020-04-29 | Disposition: A | Discharge status: Home / Self Care | Payer: BC Managed Care – PPO | Attending: Urgent Care | Admitting: Urgent Care

## 2020-04-29 DIAGNOSIS — R0789 Other chest pain: Secondary | ICD-10-CM | POA: Diagnosis not present

## 2020-04-29 DIAGNOSIS — J069 Acute upper respiratory infection, unspecified: Secondary | ICD-10-CM | POA: Insufficient documentation

## 2020-04-29 DIAGNOSIS — Z8616 Personal history of COVID-19: Secondary | ICD-10-CM | POA: Diagnosis not present

## 2020-04-29 LAB — SARS CORONAVIRUS 2 (TAT 6-24 HRS): SARS Coronavirus 2: NEGATIVE

## 2020-04-29 MED ORDER — PROMETHAZINE-DM 6.25-15 MG/5ML PO SYRP: 5.0000 mL | ORAL_SOLUTION | Freq: Every evening | ORAL | 0 refills | Status: AC | PRN

## 2020-04-29 MED ORDER — BENZONATATE 100 MG PO CAPS: 100.0000 mg | ORAL_CAPSULE | Freq: Three times a day (TID) | ORAL | 0 refills | Status: AC | PRN

## 2020-04-29 MED ORDER — CETIRIZINE HCL 10 MG PO TABS: 10.0000 mg | ORAL_TABLET | Freq: Every day | ORAL | 0 refills | Status: AC

## 2020-04-29 MED ORDER — PSEUDOEPHEDRINE HCL 60 MG PO TABS: 60.0000 mg | ORAL_TABLET | Freq: Three times a day (TID) | ORAL | 0 refills | Status: AC | PRN

## 2020-04-29 NOTE — ED Triage Notes (Signed)
Sunday night started feeling bad.  Fever yesterday 101.6.  This morning noticed with coughing made chest hurt. Noted dizziness with increased activity.  Feels fullness in head, particularly left side of head.  Blowing dark yellow this morning Home covid test yesterday and today was negative.

## 2020-04-29 NOTE — ED Provider Notes (Signed)
[facility]   MRN: [id number] DOB: 11/11/1980  Subjective:   Ian Benjamin is a 40 y.o. male presenting for 1 day history of acute onset malaise and fatigue, fever as high as 101.6 F.  He also started having a productive cough that elicited some chest pain which was mild in nature.  Has also had fullness of the left side of his head.  He took 2 COVID test at home yesterday and was negative.  Had COVID-19 in January and states that he feels a little worse now.  He did have his [employer] vaccine but no booster.  Denies history of respiratory disorders.  He is not a smoker.  No current facility-administered medications for this encounter.  Current Outpatient Medications:  .  mesalamine (LIALDA) 1.2 g EC tablet, Take 1.2 g by mouth daily with breakfast., Disp: , Rfl:  .  Pseudoephedrine-APAP-DM (DAYQUIL PO), Take by mouth., Disp: , Rfl:  .  acetaminophen (TYLENOL) 500 MG tablet, Take 500 mg by mouth every 6 (six) hours as needed., Disp: , Rfl:  .  butalbital-acetaminophen-caffeine (FIORICET) 50-325-40 MG tablet, Take 1 tablet by mouth every 6 (six) hours as needed for headache. (Patient not taking: No sig reported), Disp: 5 tablet, Rfl: 0 .  doxycycline (VIBRAMYCIN) 100 MG capsule, Take 1 capsule (100 mg total) by mouth 2 (two) times daily., Disp: 28 capsule, Rfl: 0 .  ibuprofen (ADVIL,MOTRIN) 600 MG tablet, Take 1 tablet (600 mg total) by mouth every 6 (six) hours as needed. (Patient not taking: No sig reported), Disp: 30 tablet, Rfl: 0 .  lidocaine (LIDODERM) 5 %, Place 1 patch onto the skin daily. Place to L side of neck where your pain is daily as needed for discomfort. Remove & Discard patch within 12 hours or as directed by MD, Disp: 14 patch, Rfl: 0 .  oxyCODONE-acetaminophen (PERCOCET/ROXICET) 5-325 MG tablet, Take 1-2 tablets by mouth every 6 (six) hours as needed for severe pain. (Patient not taking: No sig reported), Disp: 15 tablet, Rfl: 0 .  predniSONE  (DELTASONE) 10 MG tablet, Take 40 mg by mouth daily for 7 days, then 30mg  by mouth daily for 7 days, then 20mg  for 7 days, then 10mg  for 7 days, Disp: 70 tablet, Rfl: 0   Allergies  Allergen Reactions  . Dye Fdc Red [Red Dye] Other (See Comments)    Purple Dye caused seizures as a child    Past Medical History:  Diagnosis Date  . Ulcerative colitis [facility])      Past Surgical History:  Procedure Laterality Date  . COLONOSCOPY  2014  . WISDOM TOOTH EXTRACTION      Family History  Problem Relation Age of Onset  . Breast cancer Mother   . Gout Father   . Kidney Stones Father   . Diabetes Maternal Grandmother     Social History   Tobacco Use  . Smoking status: Never Smoker  . Smokeless tobacco: Never Used  Vaping Use  . Vaping Use: Never used  Substance Use Topics  . Alcohol use: Yes    Comment: Occasional social drinker  . Drug use: Never    ROS   Objective:   Vitals: BP 114/71 (BP Location: Left Arm)   Pulse 80   Temp 98.5 F (36.9 C) (Oral)   Resp 18   SpO2 97%   Physical Exam Constitutional:      General: He is not in acute distress.    Appearance: Normal appearance.  He is well-developed. He is not ill-appearing, toxic-appearing or diaphoretic.  HENT:     Head: Normocephalic and atraumatic.     Right Ear: External ear normal.     Left Ear: External ear normal.     Nose: Nose normal.     Mouth/Throat:     Mouth: Mucous membranes are moist.     Pharynx: Oropharynx is clear.  Eyes:     General: No scleral icterus.    Extraocular Movements: Extraocular movements intact.     Pupils: Pupils are equal, round, and reactive to light.  Cardiovascular:     Rate and Rhythm: Normal rate and regular rhythm.     Heart sounds: Normal heart sounds. No murmur heard. No friction rub. No gallop.   Pulmonary:     Effort: Pulmonary effort is normal. No respiratory distress.     Breath sounds: Normal breath sounds. No stridor. No wheezing, rhonchi or rales.   Neurological:     Mental Status: He is alert and oriented to person, place, and time.  Psychiatric:        Mood and Affect: Mood normal.        Behavior: Behavior normal.        Thought Content: Thought content normal.        Judgment: Judgment normal.       Assessment and Plan :   I have reviewed the PDMP during this encounter.  1. Viral URI with cough   2. Atypical chest pain   3. History of COVID-19     Patient has clear cardiopulmonary exam and therefore will defer imaging.  Vital signs and physical exam findings very reassuring for outpatient management.  At this point, I have low suspicion for influenza the aforementioned.  Will manage for upper respiratory infection, the common cold, possible COVID-19.  Labs pending.  Recommended supportive care. Counseled patient on potential for adverse effects with medications prescribed/recommended today, ER and return-to-clinic precautions discussed, patient verbalized understanding.    Wallis Bamberg, [place] 04/29/20 [phone]

## 2020-04-29 NOTE — Discharge Instructions (Addendum)

## 2021-04-18 IMAGING — CT CT HEAD WITHOUT CONTRAST
4 series · 15 of 47 positions shown, 17 images · non-contrast
Comparison: None.

CLINICAL DATA: 37-year-old male with acute headache, dizziness,
blurred vision and nausea for 2 days.

EXAM:
CT HEAD WITHOUT CONTRAST
TECHNIQUE: Contiguous axial images were obtained from the base of the skull
through the vertex without intravenous contrast.

[Series 3: head wo · axial · 0.47mm/px · z∈[-138,-18]mm · 7 of 34 slices shown, 9 images]
[im 5/34  brain]
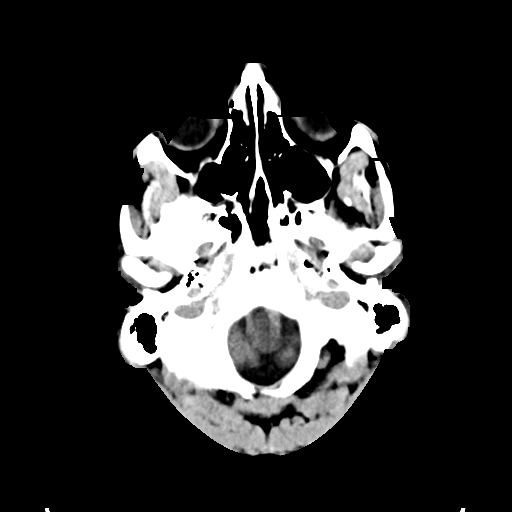
[im 5/34  bone]
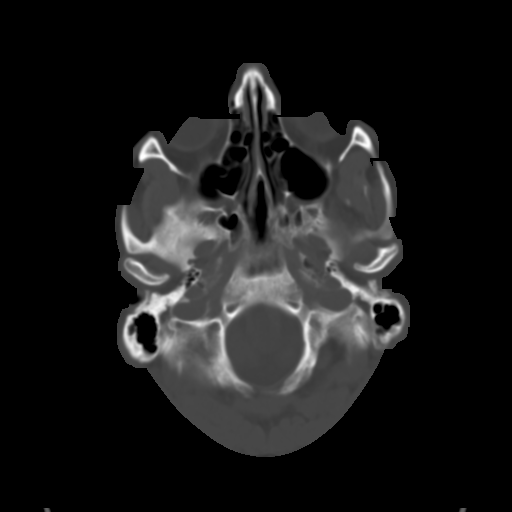
[im 9/34  brain]
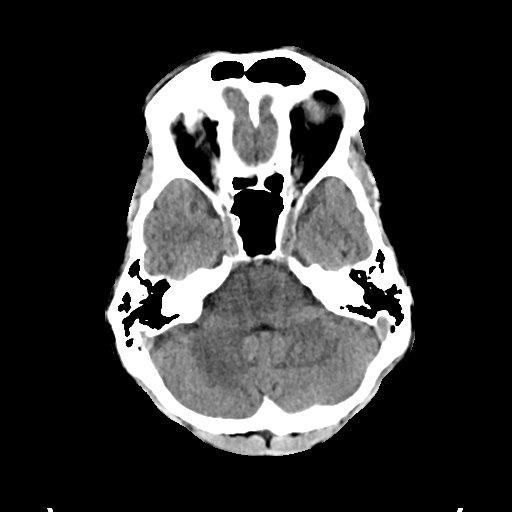
[im 13/34  brain]
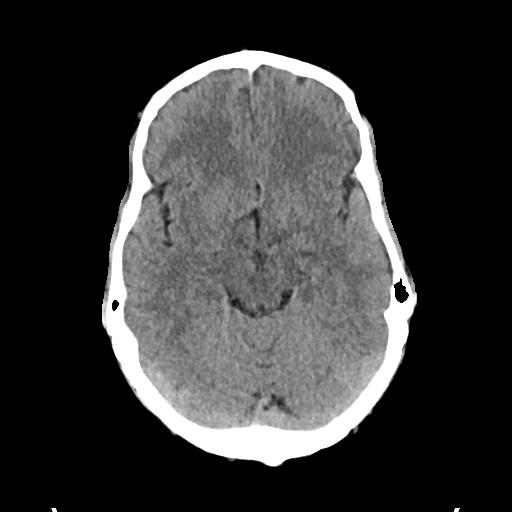
[im 17/34  brain]
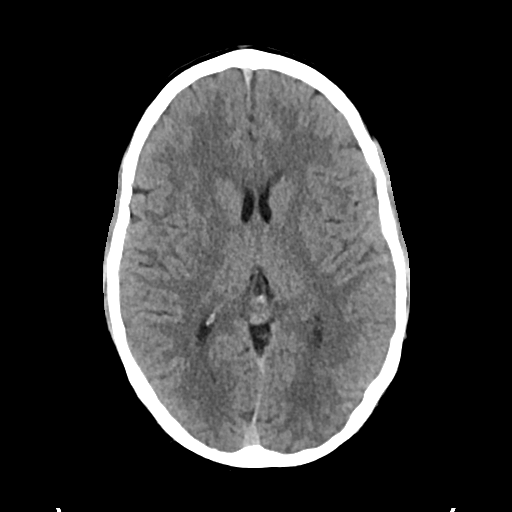
[im 21/34  brain]
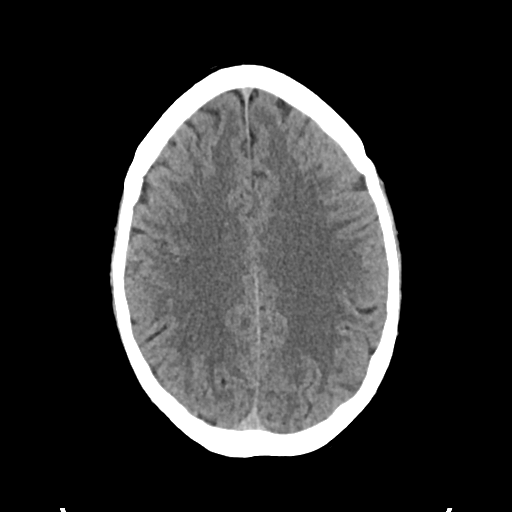
[im 21/34  bone]
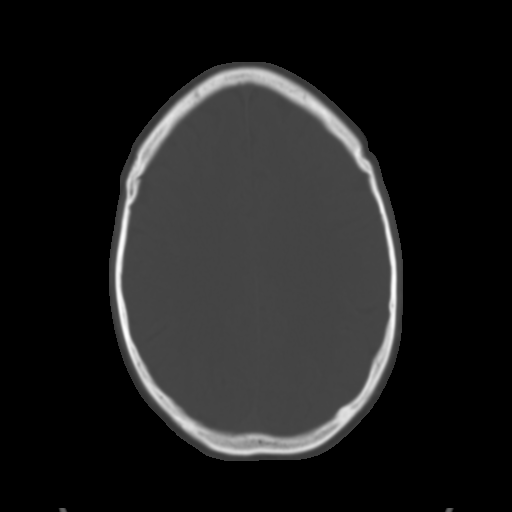
[im 25/34  brain]
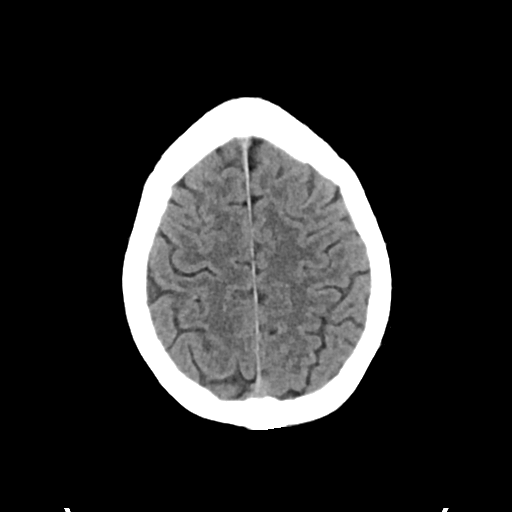
[im 29/34  brain]
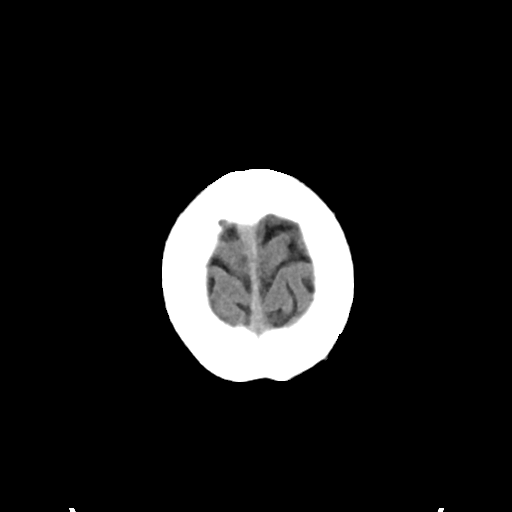

[Series 4: head bone · axial · 0.47mm/px · z∈[-142,-126]mm · 2 of 83 slices shown]
[im 9/83  bone]
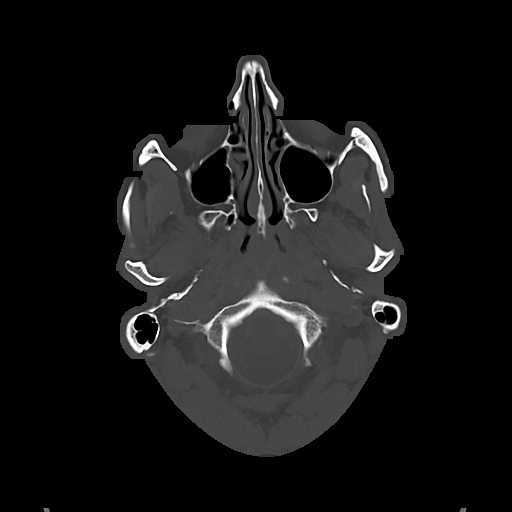
[im 17/83  bone]
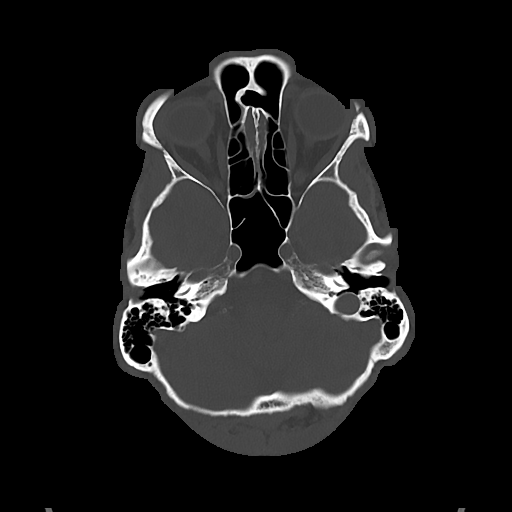

[Series 5: cor soft · coronal · 0.32mm/px · 3 of 76 slices shown]
[im 26/76  brain]
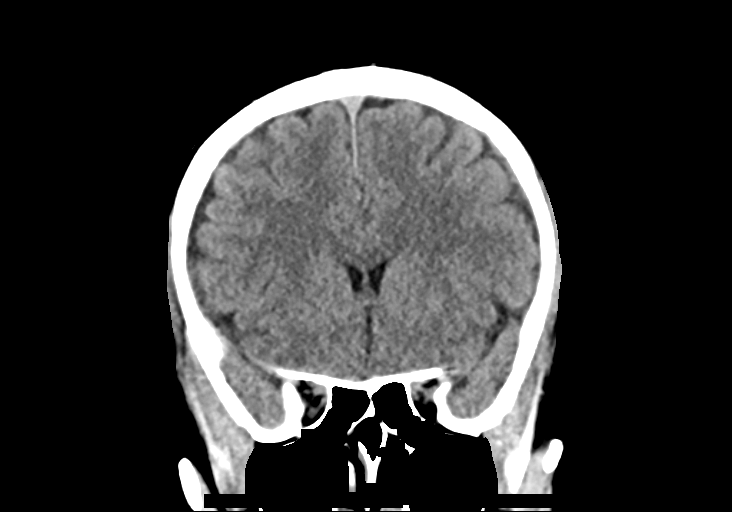
[im 34/76  brain]
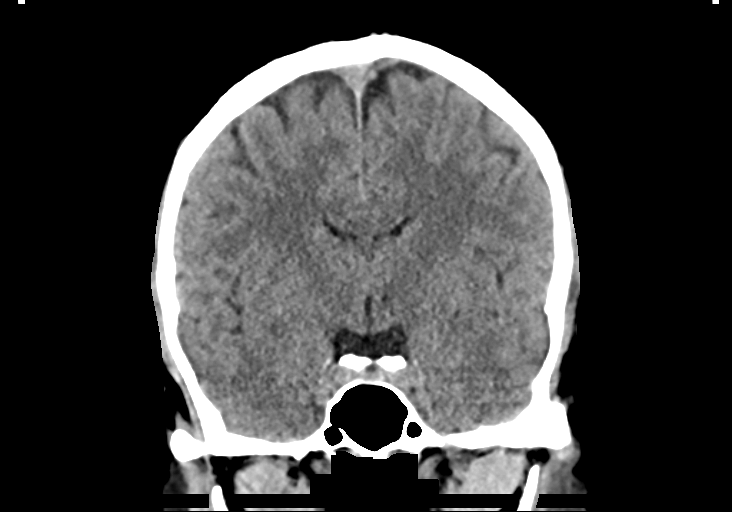
[im 42/76  brain]
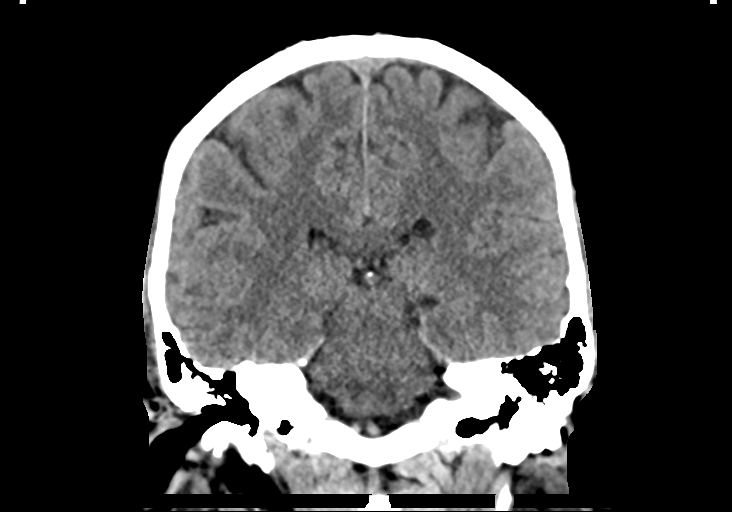

[Series 6: sag soft · sagittal · 0.32mm/px · 3 of 67 slices shown]
[im 23/67  brain]
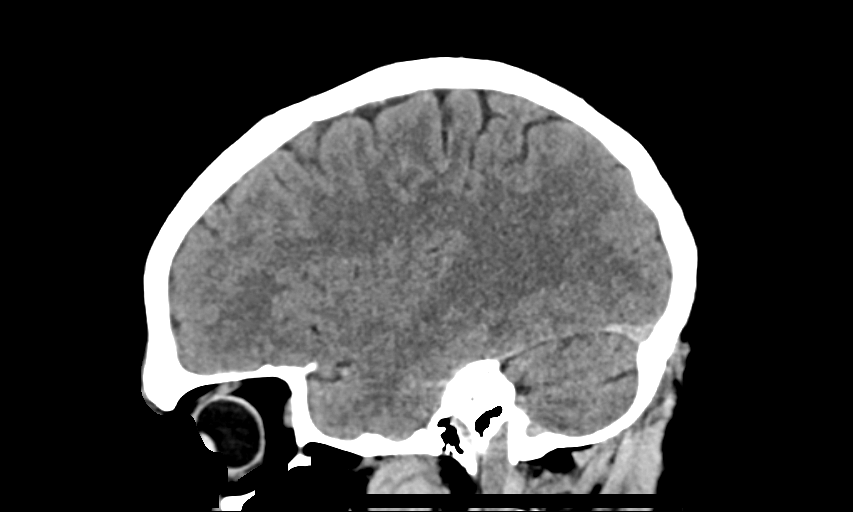
[im 34/67  brain]
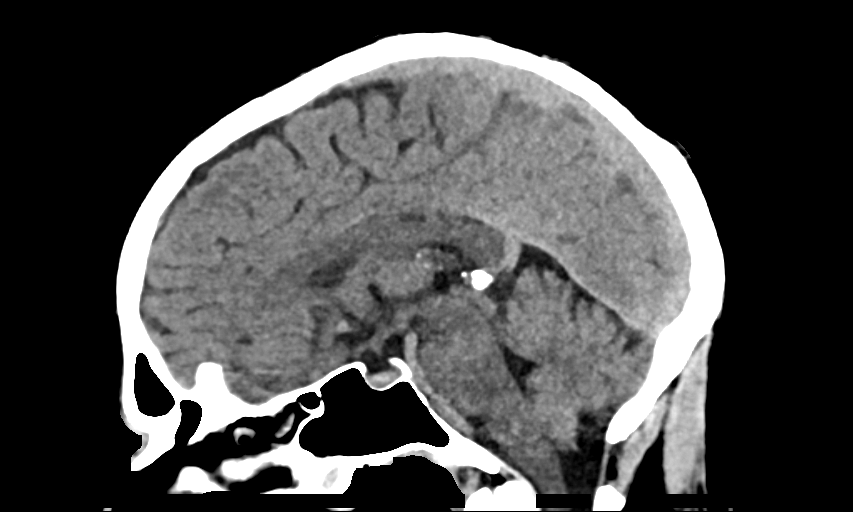
[im 45/67  brain]
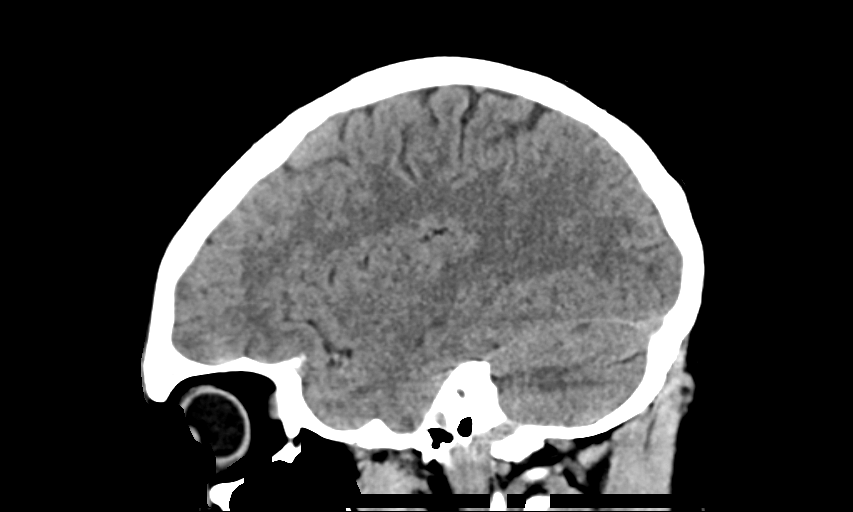

[15 of 47 positions shown; findings below may reference images not displayed]

FINDINGS: Brain: No evidence of acute infarction, hemorrhage, hydrocephalus,
extra-axial collection or mass lesion/mass effect.

Vascular: No hyperdense vessel or unexpected calcification.

Skull: Normal. Negative for fracture or focal lesion.

Sinuses/Orbits: No acute finding.

Other: None.
IMPRESSION: Unremarkable noncontrast head CT.

## 2022-03-16 ENCOUNTER — Other Ambulatory Visit: Payer: Self-pay | Admitting: Physician Assistant

## 2022-03-16 DIAGNOSIS — Z0000 Encounter for general adult medical examination without abnormal findings: Secondary | ICD-10-CM | POA: Diagnosis not present

## 2022-03-16 DIAGNOSIS — R911 Solitary pulmonary nodule: Secondary | ICD-10-CM

## 2022-03-21 DIAGNOSIS — F411 Generalized anxiety disorder: Secondary | ICD-10-CM | POA: Diagnosis not present

## 2022-03-24 ENCOUNTER — Encounter: Payer: Self-pay | Admitting: Physician Assistant

## 2022-03-29 ENCOUNTER — Ambulatory Visit
Admission: RE | Admit: 2022-03-29 | Discharge: 2022-03-29 | Disposition: A | Discharge status: Home / Self Care | Payer: BC Managed Care – PPO | Source: Ambulatory Visit | Attending: Physician Assistant | Admitting: Physician Assistant

## 2022-03-29 DIAGNOSIS — R911 Solitary pulmonary nodule: Secondary | ICD-10-CM

## 2022-03-30 DIAGNOSIS — F411 Generalized anxiety disorder: Secondary | ICD-10-CM | POA: Diagnosis not present

## 2022-04-11 DIAGNOSIS — F411 Generalized anxiety disorder: Secondary | ICD-10-CM | POA: Diagnosis not present

## 2022-04-20 DIAGNOSIS — F411 Generalized anxiety disorder: Secondary | ICD-10-CM | POA: Diagnosis not present

## 2022-04-29 DIAGNOSIS — F411 Generalized anxiety disorder: Secondary | ICD-10-CM | POA: Diagnosis not present

## 2022-05-04 DIAGNOSIS — F411 Generalized anxiety disorder: Secondary | ICD-10-CM | POA: Diagnosis not present

## 2022-05-18 DIAGNOSIS — F411 Generalized anxiety disorder: Secondary | ICD-10-CM | POA: Diagnosis not present

## 2022-06-03 DIAGNOSIS — F411 Generalized anxiety disorder: Secondary | ICD-10-CM | POA: Diagnosis not present

## 2022-06-15 DIAGNOSIS — F411 Generalized anxiety disorder: Secondary | ICD-10-CM | POA: Diagnosis not present

## 2022-06-29 DIAGNOSIS — F411 Generalized anxiety disorder: Secondary | ICD-10-CM | POA: Diagnosis not present

## 2022-08-03 DIAGNOSIS — F411 Generalized anxiety disorder: Secondary | ICD-10-CM | POA: Diagnosis not present

## 2022-08-24 DIAGNOSIS — F411 Generalized anxiety disorder: Secondary | ICD-10-CM | POA: Diagnosis not present

## 2022-09-26 DIAGNOSIS — F411 Generalized anxiety disorder: Secondary | ICD-10-CM | POA: Diagnosis not present

## 2022-10-24 DIAGNOSIS — F411 Generalized anxiety disorder: Secondary | ICD-10-CM | POA: Diagnosis not present

## 2022-11-28 DIAGNOSIS — F411 Generalized anxiety disorder: Secondary | ICD-10-CM | POA: Diagnosis not present

## 2022-12-26 DIAGNOSIS — F411 Generalized anxiety disorder: Secondary | ICD-10-CM | POA: Diagnosis not present

## 2023-04-10 DIAGNOSIS — Z23 Encounter for immunization: Secondary | ICD-10-CM | POA: Diagnosis not present

## 2023-04-10 DIAGNOSIS — L300 Nummular dermatitis: Secondary | ICD-10-CM | POA: Diagnosis not present

## 2023-04-10 DIAGNOSIS — I781 Nevus, non-neoplastic: Secondary | ICD-10-CM | POA: Diagnosis not present

## 2023-04-10 DIAGNOSIS — Z13220 Encounter for screening for lipoid disorders: Secondary | ICD-10-CM | POA: Diagnosis not present

## 2023-04-10 DIAGNOSIS — Z0000 Encounter for general adult medical examination without abnormal findings: Secondary | ICD-10-CM | POA: Diagnosis not present

## 2023-08-28 DIAGNOSIS — K6389 Other specified diseases of intestine: Secondary | ICD-10-CM | POA: Diagnosis not present

## 2023-08-28 DIAGNOSIS — K5150 Left sided colitis without complications: Secondary | ICD-10-CM | POA: Diagnosis not present

## 2023-08-28 DIAGNOSIS — K5289 Other specified noninfective gastroenteritis and colitis: Secondary | ICD-10-CM | POA: Diagnosis not present

## 2023-08-28 DIAGNOSIS — K6289 Other specified diseases of anus and rectum: Secondary | ICD-10-CM | POA: Diagnosis not present
